# Patient Record
Sex: Male | Born: 1995 | Race: Black or African American | Hispanic: No | Marital: Single | Smoking: Current every day smoker
Health system: Southern US, Community
[De-identification: ages and names within clinical notes are randomized; demographics above are authoritative.]

---

## 2014-07-09 ENCOUNTER — Emergency Department (HOSPITAL_COMMUNITY): Payer: Managed Care, Other (non HMO)

## 2014-07-09 ENCOUNTER — Emergency Department (HOSPITAL_COMMUNITY)
Admission: EM | Admit: 2014-07-09 | Discharge: 2014-07-12 | Disposition: A | Discharge status: Home / Self Care | Payer: Managed Care, Other (non HMO) | Attending: Emergency Medicine | Admitting: Emergency Medicine

## 2014-07-09 DIAGNOSIS — F29 Unspecified psychosis not due to a substance or known physiological condition: Secondary | ICD-10-CM | POA: Diagnosis not present

## 2014-07-09 DIAGNOSIS — F309 Manic episode, unspecified: Secondary | ICD-10-CM | POA: Diagnosis present

## 2014-07-09 DIAGNOSIS — R000 Tachycardia, unspecified: Secondary | ICD-10-CM | POA: Insufficient documentation

## 2014-07-09 LAB — COMPREHENSIVE METABOLIC PANEL
ALT: 30 U/L (ref 0–53)
AST: 37 U/L (ref 0–37)
Albumin: 5.2 g/dL (ref 3.5–5.2)
Alkaline Phosphatase: 92 U/L (ref 39–117)
Anion gap: 6 (ref 5–15)
BUN: 8 mg/dL (ref 6–23)
CHLORIDE: 102 mmol/L (ref 96–112)
CO2: 25 mmol/L (ref 19–32)
Calcium: 9.3 mg/dL (ref 8.4–10.5)
Creatinine, Ser: 1.23 mg/dL (ref 0.50–1.35)
GFR calc Af Amer: >90 mL/min (ref >90)
GFR, EST NON AFRICAN AMERICAN: 83 mL/min — AB (ref >90)
GLUCOSE: 112 mg/dL — AB (ref 70–99)
Potassium: 3.8 mmol/L (ref 3.5–5.1)
SODIUM: 133 mmol/L — AB (ref 135–145)
Total Bilirubin: 1 mg/dL (ref 0.3–1.2)
Total Protein: 9.1 g/dL — ABNORMAL HIGH (ref 6.0–8.3)

## 2014-07-09 LAB — CBC
HEMATOCRIT: 49.5 % (ref 39.0–52.0)
HEMOGLOBIN: 17 g/dL (ref 13.0–17.0)
MCH: 31.6 pg (ref 26.0–34.0)
MCHC: 34.3 g/dL (ref 30.0–36.0)
MCV: 92 fL (ref 78.0–100.0)
Platelets: 290 10*3/uL (ref 150–400)
RBC: 5.38 MIL/uL (ref 4.22–5.81)
RDW: 12.4 % (ref 11.5–15.5)
WBC: 7.4 10*3/uL (ref 4.0–10.5)

## 2014-07-09 LAB — ACETAMINOPHEN LEVEL: Acetaminophen (Tylenol), Serum: <10 ug/mL — ABNORMAL LOW (ref 10–30)

## 2014-07-09 LAB — SALICYLATE LEVEL

## 2014-07-09 LAB — ETHANOL: Alcohol, Ethyl (B): <5 mg/dL (ref 0–9)

## 2014-07-09 MED ORDER — ZIPRASIDONE MESYLATE 20 MG IM SOLR
20.0000 mg | Freq: Once | INTRAMUSCULAR | Status: AC
  Administered 2014-07-09: 20 mg via INTRAMUSCULAR
  Filled 2014-07-09: qty 20

## 2014-07-09 MED ORDER — ACETAMINOPHEN 325 MG PO TABS: 650.0000 mg | ORAL_TABLET | ORAL | Status: DC | PRN

## 2014-07-09 MED ORDER — STERILE WATER FOR INJECTION IJ SOLN
INTRAMUSCULAR | Status: AC
  Administered 2014-07-09: 1.2 mL
  Filled 2014-07-09: qty 10

## 2014-07-09 MED ORDER — ONDANSETRON HCL 4 MG PO TABS: 4.0000 mg | ORAL_TABLET | Freq: Three times a day (TID) | ORAL | Status: DC | PRN

## 2014-07-09 NOTE — ED Notes (Addendum)
Pt brought in by [employer], IVC. Staff unable to get direct answers from pt. Per IVC paperwork pt unable to speak coherently and states his name means Brooke DareKing, [employer] Angel, and the devil. He reports he is able to hypnotize people. Pt is delusional with hallucinations.  Pt has no hx of commitment. Unable to determine if pt is SI/HI. Unable to complete triage pt unable to answer questions. Pt family will be here tomorrow (traveling from [place]).

## 2014-07-09 NOTE — ED Provider Notes (Signed)
CSN: [id number]     Arrival date & time 07/09/14  2030 History  This chart was scribed for Elpidio AnisShari Radie Berges, PA-C, working with Pricilla LovelessScott Goldston, MD by Elon SpannerGarrett Cook, ED Scribe. This patient was seen in room [facility] and the patient's care was started at 9:50 PM.   Chief Complaint  Patient presents with  . Manic Behavior   The history is provided by the patient. The history is limited by the condition of the patient. No language interpreter was used.  Level 5 Caveat (Acute Psychosis) HPI Comments: Art BuffMalik Mcquain is a 19 y.o. male with no psychiatric history [facility] who presents to the Emergency Department complaining of manic behavior onset four days ago.  This evening, [employer] was called to the patient's home by several of the patient's friends who had become concerned about his recent behavioral change. GPD reports they were told by the patient's friends that he has recently experienced some significant stressors including breaking up with his girlfriend, being cut off financially by parents, and being withdrawn from his college classes due to his GPA.  Per nursing notes, the patient is speaking in a fragmented, grandiose, incoherent manner stating he is able to hypnotize people and that he will steal your soul.  Per [employer], the patient has no previous psychiatric diagnosis and takes no medications.    No past medical history on file. No past surgical history on file. No family history on file. History  Substance Use Topics  . Smoking status: Not on file  . Smokeless tobacco: Not on file  . Alcohol Use: Not on file    Review of Systems  Unable to perform ROS: Psychiatric disorder      Allergies  Review of patient's allergies indicates not on file.  Home Medications   Prior to Admission medications   Not on File   BP 153/99 mmHg  Pulse 112  Temp(Src) 100.1 F (37.8 C)  Resp 18  SpO2 93% Physical Exam  Constitutional: He appears well-developed and well-nourished. No  distress.  HENT:  Head: Normocephalic and atraumatic.  Eyes: Conjunctivae and EOM are normal.  Neck: Neck supple. No tracheal deviation present.  Cardiovascular: Tachycardia present.   Pulmonary/Chest: Effort normal. No respiratory distress.  Musculoskeletal: Normal range of motion.  Neurological: He is alert.  Psychiatric: His speech is normal. His affect is angry. He is agitated and actively hallucinating.  Nursing note and vitals reviewed.   ED Course  Procedures (including critical care time) Labs Review Labs Reviewed  ACETAMINOPHEN LEVEL  CBC  COMPREHENSIVE METABOLIC PANEL  ETHANOL  SALICYLATE LEVEL  URINE RAPID DRUG SCREEN (HOSP PERFORMED)    Imaging Review No results found.   EKG Interpretation None      MDM   Final diagnoses:  None    1. Acute psychosis  No reliable history can be obtained from the patient secondary to current mental state. He refuses lab draw, hands on physical exam. He is not cooperative and is borderline aggressive, per [employer]. He is currently in handcuff restraint. Discussed the patient with Dr. Criss AlvineGoldston. Geodon ordered to facilitate full medical evaluation and for patient and staff safety. TTS consult pending for placement.  I personally performed the services described in this documentation, which was scribed in my presence. The recorded information has been reviewed and is accurate.     Elpidio AnisShari Steadman Prosperi, PA-C 07/09/14 2244  Pricilla LovelessScott Goldston, MD 07/10/14 [id number]

## 2014-07-09 NOTE — ED Notes (Signed)
Pt currently resting, sitter at bedside. Restraints intact

## 2014-07-09 NOTE — ED Notes (Signed)
Unable to collect labs at this time because of patient behavior.  I made nurse aware.

## 2014-07-09 NOTE — ED Notes (Signed)
Patient transported to CT w/ sitter

## 2014-07-09 NOTE — ED Notes (Signed)
Pt speaking in racial language, speaking to "society", pt speaking to self, unable to direct or ask questions at this time, [employer] at bedside.

## 2014-07-10 DIAGNOSIS — F29 Unspecified psychosis not due to a substance or known physiological condition: Secondary | ICD-10-CM | POA: Diagnosis not present

## 2014-07-10 LAB — RAPID URINE DRUG SCREEN, HOSP PERFORMED
Amphetamines: NOT DETECTED
Barbiturates: NOT DETECTED
Benzodiazepines: NOT DETECTED
Cocaine: NOT DETECTED
OPIATES: NOT DETECTED
Tetrahydrocannabinol: POSITIVE — AB

## 2014-07-10 MED ORDER — HYDROXYZINE HCL 25 MG PO TABS: 50.0000 mg | ORAL_TABLET | Freq: Three times a day (TID) | ORAL | Status: DC | PRN

## 2014-07-10 NOTE — ED Notes (Signed)
Aware urine sample is needed. 

## 2014-07-10 NOTE — ED Notes (Signed)
[facility] Expected date:  Expected time:  Means of arrival:  Comments: [facility] 26

## 2014-07-10 NOTE — ED Notes (Signed)
Pt has flight of ideas and goes from one extreme to the next. Pt reports that he has ADHD and just wants to talk.

## 2014-07-10 NOTE — ED Notes (Signed)
Continues to have disorganized thoughts and needs consistent redirection.  Acuity moderate to high.

## 2014-07-10 NOTE — ED Notes (Addendum)
Pt's mother contacted TCU (confirmed by birth date and patient)-patient is speaking with her at this time.   Mother called back, informed of plan of care for patient. Family will be here from [place] around 1600.

## 2014-07-10 NOTE — ED Notes (Signed)
Pt reported that he does not eat. Has not eaten breakfast or lunch. Reports that he does not want anything to drink at this time.

## 2014-07-10 NOTE — ED Notes (Signed)
Pt comes to the desk and talks about random things and even gets animated in telling us that we are stupid. Reports that he does not need his family now and that they have not been around for 2 years only his brother. Comes to the desk and says that his father is a donor and donors can check on their sperm. Then proceeds to say that [facility] [employer] and going to write a movie. Reports that we do not see what he can because he can see into the future. Reports that he is going to be a star and already is. Pt reported that he does not have to listen to us because we are asking him to do things that he does not want to and that he will listen for now. Keeps repeating that we think that he is dumb but he is not.

## 2014-07-10 NOTE — ED Notes (Signed)
When pt returned from CT restraints were kept off, pt resting at this time, in no distress.

## 2014-07-10 NOTE — Progress Notes (Signed)
[place] [facility] [place] informed CM [place] therefore [place] Pt informed CM he "did something stupid to get in here but I'm going to change me ways"  CM encouraged pt and allow him time to ventilate.

## 2014-07-10 NOTE — ED Notes (Signed)
Patient grandiose and hyperverbal.  Fluids given along with urine cup.  Acuity moderate.

## 2014-07-10 NOTE — ED Notes (Signed)
Pt requesting to have [employer] telephone number reporting that the guy that owns it is the band director named Ron that would be investing in his dream. Someone that he is calling his dad.

## 2014-07-10 NOTE — BH Assessment (Signed)
Assessment Note  Stephen Raymond is an 19 y.o. male brought in by [employer]. Sts that friends called [employer] reporting patient was behaving in a bizarre manner. Writer met with patient who presented with disorganized thought and speech.  He was hyperverbal, tangential, and grandiose during the assessment.Patient who was accompanied by a school friend. Patient sts that the friend in the room with him is his best friend, they attend [employer] together, and both play drums in the band.The friend concurred with this information. Patient reported that he moved form [place], [place] to attend [employer].Patient however sts that he has completed school but still plays in the band. Patient sts that that he has a twin brother that was living in [place], [place] but moved back to [place]. Patient has not further family in the [place] area. Throughout the assessment patient had flight of ideas. This writer would redirect patient who was fixated on ""God and a Girl". Patient denies previous psychiatric history, hospitalizations, etc. He denies SI and HI. He also denies alcohol and drug use. Patient's ETOH is negative and UDS has not yet resulted during the time of this assessment.   Patient was a poor historian and not able to provide a lot of information during the assessment. Patient's mother is expected to be here in the ED approx. 4pm. This Probation officer or another TTS counselor will need to obtain collateral information from his mother.  Patient evaluated by Reginold Agent, NP and inpatient treatment was recommended.   Axis I:  Psychotic Disorder NOS Axis II: Deferred Axis III: No past medical history on file. Axis IV: other psychosocial or environmental problems, problems related to social environment, problems with access to health care services and problems with primary support group Axis V: 31-40 impairment in reality testing   Past Medical History: No past medical history on file.  No past surgical history on  file.  Family History: No family history on file.  Social History:  has no tobacco, alcohol, and drug history on file.  Additional Social History:  Alcohol / Drug Use Pain Medications: SEE MAR Prescriptions: SEE MAR Over the Counter: SEE MAR History of alcohol / drug use?: No history of alcohol / drug abuse (No UDS resulted as of 07/10/2014 @ 1441; ETOH negative )  CIWA: CIWA-Ar BP: 146/91 mmHg Pulse Rate: 74 COWS:    Allergies: No Known Allergies  Home Medications:  (Not in a hospital admission)  OB/GYN Status:  No LMP for male patient.  General Assessment Data Location of Assessment: [facility] Is this a Tele or Face-to-Face Assessment?: Face-to-Face Is this an Initial Assessment or a Re-assessment for this encounter?: Initial Assessment Can pt return to current living arrangement?: No Admission Status: Voluntary Is patient capable of signing voluntary admission?: Yes Transfer from: [facility] Referral Source: Self/Family/Friend     [place] Name of Psychiatrist:  (No psychiatrist ) Name of Therapist:  (No therapist )  Education Status Is patient currently in school?: No  Risk to self with the past 6 months Suicidal Ideation: No Suicidal Intent: No Is patient at risk for suicide?: No Suicidal Plan?: No Access to Means: No What has been your use of drugs/alcohol within the last 12 months?:  (patient denies ) Previous Attempts/Gestures: No How many times?:  (n/a) Other Self Harm Risks:  (n/a) Triggers for Past Attempts: Other (Comment) (no previous attempts or gestures ) Intentional Self Injurious Behavior: None Recent stressful life event(s): Other (Comment) (patient reports  ) Persecutory voices/beliefs?:  No Depression: Yes Depression Symptoms: Feeling angry/irritable, Feeling worthless/self pity, Guilt, Loss of interest in usual pleasures, Fatigue, Isolating, Tearfulness, Insomnia, Despondent Substance abuse history and/or treatment for  substance abuse?: No Suicide prevention information given to non-admitted patients: Not applicable  Risk to Others within the past 6 months Homicidal Ideation: No Thoughts of Harm to Others: No Current Homicidal Intent: No Current Homicidal Plan: No Access to Homicidal Means: No Identified Victim:  (n/a) History of harm to others?: No Assessment of Violence: None Noted Violent Behavior Description:  (patient is calm and cooperative ) Does patient have access to weapons?: No Criminal Charges Pending?: No Does patient have a court date: No  Psychosis Hallucinations: None noted (bizarre and manic behaviors although pt denies AVH's) Delusions: Unspecified (believes that he is able to hypnotize people)  Mental Status Report Appearance/Hygiene: In scrubs Eye Contact: Good Motor Activity: Freedom of movement Speech: Logical/coherent Level of Consciousness: Alert Mood: Depressed Affect: Appropriate to circumstance Anxiety Level: None Thought Processes: Relevant, Coherent Judgement: Impaired Orientation: Place, Person, Situation, Time Obsessive Compulsive Thoughts/Behaviors: None  Cognitive Functioning Concentration: Decreased Memory: Recent Intact, Remote Intact IQ: Average Insight: Poor Impulse Control: Poor Appetite: Good Weight Loss:  (none reported ) Weight Gain:  (none reported ) Sleep: Unable to Assess Total Hours of Sleep:  (unk) Vegetative Symptoms: Unable to Assess  ADLScreening [facility] Assessment Services) Patient's cognitive ability adequate to safely complete daily activities?: Yes Patient able to express need for assistance with ADLs?: No Independently performs ADLs?: Yes (appropriate for developmental age)  Prior Inpatient Therapy Prior Inpatient Therapy: No Prior Therapy Dates:  (n/a) Prior Therapy Facilty/Provider(s):  (n/a) Reason for Treatment:  (n/a)  Prior Outpatient Therapy Prior Outpatient Therapy: No Prior Therapy Dates:  (n/a) Prior Therapy  Facilty/Provider(s):  (n/a) Reason for Treatment:  (n/a)  ADL Screening (condition at time of admission) Patient's cognitive ability adequate to safely complete daily activities?: Yes Is the patient deaf or have difficulty hearing?: No Does the patient have difficulty seeing, even when wearing glasses/contacts?: No Does the patient have difficulty concentrating, remembering, or making decisions?: Yes (due to patient's psychosis) Patient able to express need for assistance with ADLs?: No Does the patient have difficulty dressing or bathing?: No Independently performs ADLs?: Yes (appropriate for developmental age) Does the patient have difficulty walking or climbing stairs?: No Weakness of Legs: None Weakness of Arms/Hands: None  Home Assistive Devices/Equipment Home Assistive Devices/Equipment: None    Abuse/Neglect Assessment (Assessment to be complete while patient is alone) Physical Abuse: Denies Verbal Abuse: Denies Sexual Abuse: Denies Exploitation of patient/patient's resources: Denies Self-Neglect: Denies Values / Beliefs Cultural Requests During Hospitalization: None Spiritual Requests During Hospitalization: None   Advance Directives (For Healthcare) Does patient have an advance directive?: No    Additional Information 1:1 In Past 12 Months?: No CIRT Risk: No Elopement Risk: No Does patient have medical clearance?: Yes     Disposition:  Disposition Initial Assessment Completed for this Encounter: Yes Disposition of Patient: Inpatient treatment program Type of inpatient treatment program: Adult (Dr. Reginold Agent & Dr. Darleene Cleaver recommend)  On Site Evaluation by:   Reviewed with Physician:    Waldon Merl [facility] 07/10/2014 3:04 PM

## 2014-07-10 NOTE — Consult Note (Signed)
[facility] Consult   Reason for Consult: Psychosis, Unspecified Referring Physician: EDP Patient Identification: Stephen Raymond MRN:  [id number] Principal Diagnosis: Psychosis Diagnosis:   Patient Active Problem List   Diagnosis Date Noted  . Psychosis [F29] 07/10/2014    Priority: High    Total Time spent with patient: 1 hour  Subjective:   Stephen Raymond is a 19 y.o. male patient admitted with Psychosis.  HPI:  AA male 19 years old was brought in by Stephen Raymond for disorganized thought and speech.  This morning he could not participate in this interview.  He was tangential and did not make sense in his his speech.  He reported that he moved form [place] and wanted to go to school.  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  Patient   Reported that he has two dads, one is a step dad and one is a biological dad who he look more like.  He also stated that he has  a mother.  Patient then stated that he does not have anybody in [place].  Patient denied any drug use.  At times he rambles off topic and needed to be redirected back to topic in discussion.  We have accepted patient for admission and will be seeking placement at any facility with available bed.  We will be gathering collateral information from his mother.  HPI Elements:   Location:  Psychosis. Quality:  severe. Severity:  severe. Timing:  Acute. Duration:  Sudden onset. Context:  Brought for  evaluation ofdisorganized thought .  Past Medical History: No past medical history on file. No past surgical history on file. Family History: No family history on file. Social History:  History  Alcohol Use: Not on file     History  Drug Use Not on file    History   Social History  . Marital Status: Single    Spouse Name: N/A  . Number of Children: N/A  . Years of Education: N/A   Social History Main Topics  . Smoking status: Not on file  . Smokeless tobacco: Not on  file  . Alcohol Use: Not on file  . Drug Use: Not on file  . Sexual Activity: Not on file   Other Topics Concern  . Not on file   Social History Narrative  . No narrative on file   Additional Social History:                          Allergies:  No Known Allergies  Labs:  Results for orders placed or performed during the Raymond encounter of 07/09/14 (from the past 48 hour(s))  Acetaminophen level     Status: Abnormal   Collection Time: 07/09/14 10:25 PM  Result Value Ref Range   Acetaminophen (Tylenol), Serum <10.0 (L) 10 - 30 ug/mL    Comment:        THERAPEUTIC CONCENTRATIONS VARY SIGNIFICANTLY. A RANGE OF 10-30 ug/mL MAY BE AN EFFECTIVE CONCENTRATION FOR MANY PATIENTS. HOWEVER, SOME ARE BEST TREATED AT CONCENTRATIONS OUTSIDE THIS RANGE. ACETAMINOPHEN CONCENTRATIONS >150 ug/mL AT 4 HOURS AFTER INGESTION AND >50 ug/mL AT 12 HOURS AFTER INGESTION ARE OFTEN ASSOCIATED WITH TOXIC REACTIONS.   CBC     Status: None   Collection Time: 07/09/14 10:25 PM  Result Value Ref Range   WBC 7.4 4.0 - 10.5 K/uL   RBC 5.38 4.22 - 5.81 MIL/uL  Hemoglobin 17.0 13.0 - 17.0 g/dL   HCT 49.5 39.0 - 52.0 %   MCV 92.0 78.0 - 100.0 fL   MCH 31.6 26.0 - 34.0 pg   MCHC 34.3 30.0 - 36.0 g/dL   RDW 12.4 11.5 - 15.5 %   Platelets 290 150 - 400 K/uL  Comprehensive metabolic panel     Status: Abnormal   Collection Time: 07/09/14 10:25 PM  Result Value Ref Range   Sodium 133 (L) 135 - 145 mmol/L   Potassium 3.8 3.5 - 5.1 mmol/L   Chloride 102 96 - 112 mmol/L   CO2 25 19 - 32 mmol/L   Glucose, Bld 112 (H) 70 - 99 mg/dL   BUN 8 6 - 23 mg/dL   Creatinine, Ser 1.23 0.50 - 1.35 mg/dL   Calcium 9.3 8.4 - 10.5 mg/dL   Total Protein 9.1 (H) 6.0 - 8.3 g/dL   Albumin 5.2 3.5 - 5.2 g/dL   AST 37 0 - 37 U/L   ALT 30 0 - 53 U/L   Alkaline Phosphatase 92 39 - 117 U/L   Total Bilirubin 1.0 0.3 - 1.2 mg/dL   GFR calc non Af Amer 83 (L) >90 mL/min   GFR calc Af Amer >90 >90 mL/min     Comment: (NOTE) The eGFR has been calculated using the CKD EPI equation. This calculation has not been validated in all clinical situations. eGFR's persistently <90 mL/min signify possible Chronic Kidney Disease.    Anion gap 6 5 - 15  Ethanol (ETOH)     Status: None   Collection Time: 07/09/14 10:25 PM  Result Value Ref Range   Alcohol, Ethyl (B) <5 0 - 9 mg/dL    Comment:        LOWEST DETECTABLE LIMIT FOR SERUM ALCOHOL IS 11 mg/dL FOR MEDICAL PURPOSES ONLY   Salicylate level     Status: None   Collection Time: 07/09/14 10:25 PM  Result Value Ref Range   Salicylate Lvl <8.7 2.8 - 20.0 mg/dL    Vitals: Blood pressure 105/47, pulse 103, temperature 97.5 F (36.4 C), temperature source Oral, resp. rate 20, SpO2 95 %.  Risk to Self: Is patient at risk for suicide?: No, but patient needs Medical Clearance Risk to Others:   Prior Inpatient Therapy:   Prior Outpatient Therapy:    Current Facility-Administered Medications  Medication Dose Route Frequency Provider Last Rate Last Dose  . acetaminophen (TYLENOL) tablet 650 mg  650 mg Oral Q4H PRN Charlann Lange, PA-C      . ondansetron (ZOFRAN) tablet 4 mg  4 mg Oral Q8H PRN Charlann Lange, PA-C       No current outpatient prescriptions on file.    Musculoskeletal: Strength & Muscle Tone: within normal limits Gait & Station: normal Patient leans: N/A  Psychiatric Specialty Exam:     Blood pressure 105/47, pulse 103, temperature 97.5 F (36.4 C), temperature source Oral, resp. rate 20, SpO2 95 %.There is no height or weight on file to calculate BMI.  General Appearance: Casual and Fairly Groomed  Engineer, water::  Fair  Speech:  Normal Rate and tangential,  Volume:  Normal  Mood:  Anxious  Affect:  Congruent and Constricted  Thought Process:  Circumstantial, Disorganized and Tangential  Orientation:  Full (Time, Place, and Person)  Thought Content:  Delusions  Suicidal Thoughts:  No  Homicidal Thoughts:  No  Memory:   Immediate;   Poor Recent;   Poor Remote;   Poor  Judgement:  Impaired  Insight:  Lacking  Psychomotor Activity:  Normal  Concentration:  Poor  Recall:  NA  Fund of Knowledge:Poor  Language: Fair  Akathisia:  NA  Handed:  Right  AIMS (if indicated):     Assets:  Desire for Improvement  ADL's:  Intact  Cognition: Impaired,  Severe  Sleep:      Medical Decision Making: Review of Psycho-Social Stressors (1) and Established Problem, Worsening (2)  Treatment Plan Summary: Daily contact with patient to assess and evaluate symptoms and progress in treatment, Medication management and Plan admit and seek placement  Plan:  Recommend psychiatric Inpatient admission when medically cleared. Disposition: see above  Delfin Gant   PMHNP-BC 07/10/2014 11:45 AM Patient seen face-to-face for psychiatric evaluation, chart reviewed and case discussed with the physician extender and developed treatment plan. Reviewed the information documented and agree with the treatment plan. Corena Pilgrim, MD

## 2014-07-10 NOTE — ED Notes (Addendum)
Pt agreed to have a shower.  Has one visitor at this time. Social work in to speak with patient.  Refuses Hydroxyzine at this time.

## 2014-07-10 NOTE — ED Notes (Signed)
Pt informed he only has three allowed phone calls per day. Pt asks, "well is it written down somewhere?" Given [facility] overview rules. 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

## 2014-07-10 NOTE — ED Notes (Signed)
Patient's father updated with basic information-father able to give identifying information regarding patient information and situation. Patient knows to contact patient's mother for updated information once she arrives here.

## 2014-07-10 NOTE — BH Assessment (Signed)
[facility] Assessment Progress Note  The following facilities have been contacted in an effort to place this patient, with results as noted:  Beds available, information faxed, decision pending:  [facility]  No IPRS beds currently available:  [facility]  At capacity:  [facility]   Burle Kwan, MA Triage Specialist [phone]     

## 2014-07-10 NOTE — ED Notes (Signed)
Pt taking shower at present, calm & cooperative, interactive with staff.  Monitoring for safety, Q 15 min checks in effect.  AAO x 3, no distress noted.

## 2014-07-10 NOTE — ED Notes (Signed)
Psych MD and PA at bedside to interview pt.

## 2014-07-10 NOTE — ED Notes (Signed)
Pt has come to the desk and asked to be able to explain to everyone why God is real.  He is calm and cooperative and has been asked to remain in his room.

## 2014-07-11 DIAGNOSIS — F29 Unspecified psychosis not due to a substance or known physiological condition: Secondary | ICD-10-CM | POA: Insufficient documentation

## 2014-07-11 MED ORDER — RISPERIDONE 0.5 MG PO TABS
0.5000 mg | ORAL_TABLET | Freq: Two times a day (BID) | ORAL | Status: DC
  Administered 2014-07-11 – 2014-07-12 (×3): 0.5 mg via ORAL
  Filled 2014-07-11 (×3): qty 1

## 2014-07-11 MED ORDER — ZIPRASIDONE MESYLATE 20 MG IM SOLR
20.0000 mg | Freq: Once | INTRAMUSCULAR | Status: AC
  Administered 2014-07-11: 20 mg via INTRAMUSCULAR
  Filled 2014-07-11: qty 20

## 2014-07-11 MED ORDER — LORAZEPAM 2 MG/ML IJ SOLN
2.0000 mg | Freq: Once | INTRAMUSCULAR | Status: AC
  Administered 2014-07-11: 2 mg via INTRAMUSCULAR
  Filled 2014-07-11: qty 1

## 2014-07-11 MED ORDER — DIPHENHYDRAMINE HCL 50 MG/ML IJ SOLN
50.0000 mg | Freq: Once | INTRAMUSCULAR | Status: AC
  Administered 2014-07-11: 50 mg via INTRAMUSCULAR
  Filled 2014-07-11: qty 1

## 2014-07-11 NOTE — Consult Note (Signed)
[place] Psychiatry Consult   Reason for Consult: Psychosis, Unspecified Referring Physician: EDP Patient Identification: Stephen Raymond MRN:  [id number] Principal Diagnosis: Psychosis Diagnosis:   Patient Active Problem List   Diagnosis Date Noted  . Psychosis [F29] 07/10/2014    Priority: High    Total Time spent with patient: 1 hour  Subjective:   Stephen Raymond is a 19 y.o. male patient admitted with Psychosis.  HPI:  AA male 19 years old was brought in by [facility] for disorganized thought and speech.  This morning he could not participate in this interview.  He was tangential and did not make sense in his his speech.  He reported that he moved form [place] and wanted to go to school.  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  Patient   Reported that he has two dads, one is a step dad and one is a biological dad who he look more like.  He also stated that he has  a mother.  Patient then stated that he does not have anybody in [place].  Patient denied any drug use.  At times he rambles off topic and needed to be redirected back to topic in discussion.  We have accepted patient for admission and will be seeking placement at any facility with available bed.  We will be gathering collateral information from his mother.  07/11/2014:  Patient was seen today with her mother at the bedside.  Mother drove down from [place] to see patient.  Patient is still disorganized and tangential needing redirecting.  Patient admitted to using Marijuana but denied using any other drug.  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  His mother reported that Patient's grand mothers twin sister on his father's side had mental illness before dying.  Patient denied SI/HI/AVH.  He is started on Risperdal and we are seeking placement at any hospital with available bed.  HPI Elements:   Location:  Psychosis. Quality:  severe. Severity:   severe. Timing:  Acute. Duration:  Sudden onset. Context:  Brought for  evaluation ofdisorganized thought .  Past Medical History: No past medical history on file. No past surgical history on file. Family History: No family history on file. Social History:  History  Alcohol Use: Not on file     History  Drug Use Not on file    History   Social History  . Marital Status: Single    Spouse Name: N/A  . Number of Children: N/A  . Years of Education: N/A   Social History Main Topics  . Smoking status: Not on file  . Smokeless tobacco: Not on file  . Alcohol Use: Not on file  . Drug Use: Not on file  . Sexual Activity: Not on file   Other Topics Concern  . Not on file   Social History Narrative  . No narrative on file   Additional Social History:    Pain Medications: SEE MAR Prescriptions: SEE MAR Over the Counter: SEE MAR History of alcohol / drug use?: No history of alcohol / drug abuse (No UDS resulted as of 07/10/2014 @ 1441; ETOH negative )                     Allergies:  No Known Allergies  Labs:  Results for orders placed or performed during the hospital encounter of 07/09/14 (from the past 48 hour(s))  Acetaminophen level     Status: Abnormal   Collection Time: 07/09/14 10:25 PM  Result Value Ref Range   Acetaminophen (Tylenol), Serum <10.0 (L) 10 - 30 ug/mL    Comment:        THERAPEUTIC CONCENTRATIONS VARY SIGNIFICANTLY. A RANGE OF 10-30 ug/mL MAY BE AN EFFECTIVE CONCENTRATION FOR MANY PATIENTS. HOWEVER, SOME ARE BEST TREATED AT CONCENTRATIONS OUTSIDE THIS RANGE. ACETAMINOPHEN CONCENTRATIONS >150 ug/mL AT 4 HOURS AFTER INGESTION AND >50 ug/mL AT 12 HOURS AFTER INGESTION ARE OFTEN ASSOCIATED WITH TOXIC REACTIONS.   CBC     Status: None   Collection Time: 07/09/14 10:25 PM  Result Value Ref Range   WBC 7.4 4.0 - 10.5 K/uL   RBC 5.38 4.22 - 5.81 MIL/uL   Hemoglobin 17.0 13.0 - 17.0 g/dL   HCT 49.5 39.0 - 52.0 %   MCV 92.0 78.0 - 100.0  fL   MCH 31.6 26.0 - 34.0 pg   MCHC 34.3 30.0 - 36.0 g/dL   RDW 12.4 11.5 - 15.5 %   Platelets 290 150 - 400 K/uL  Comprehensive metabolic panel     Status: Abnormal   Collection Time: 07/09/14 10:25 PM  Result Value Ref Range   Sodium 133 (L) 135 - 145 mmol/L   Potassium 3.8 3.5 - 5.1 mmol/L   Chloride 102 96 - 112 mmol/L   CO2 25 19 - 32 mmol/L   Glucose, Bld 112 (H) 70 - 99 mg/dL   BUN 8 6 - 23 mg/dL   Creatinine, Ser 1.23 0.50 - 1.35 mg/dL   Calcium 9.3 8.4 - 10.5 mg/dL   Total Protein 9.1 (H) 6.0 - 8.3 g/dL   Albumin 5.2 3.5 - 5.2 g/dL   AST 37 0 - 37 U/L   ALT 30 0 - 53 U/L   Alkaline Phosphatase 92 39 - 117 U/L   Total Bilirubin 1.0 0.3 - 1.2 mg/dL   GFR calc non Af Amer 83 (L) >90 mL/min   GFR calc Af Amer >90 >90 mL/min    Comment: (NOTE) The eGFR has been calculated using the CKD EPI equation. This calculation has not been validated in all clinical situations. eGFR's persistently <90 mL/min signify possible Chronic Kidney Disease.    Anion gap 6 5 - 15  Ethanol (ETOH)     Status: None   Collection Time: 07/09/14 10:25 PM  Result Value Ref Range   Alcohol, Ethyl (B) <5 0 - 9 mg/dL    Comment:        LOWEST DETECTABLE LIMIT FOR SERUM ALCOHOL IS 11 mg/dL FOR MEDICAL PURPOSES ONLY   Salicylate level     Status: None   Collection Time: 07/09/14 10:25 PM  Result Value Ref Range   Salicylate Lvl <4.7 2.8 - 20.0 mg/dL  Urine Drug Screen     Status: Abnormal   Collection Time: 07/10/14  4:24 PM  Result Value Ref Range   Opiates NONE DETECTED NONE DETECTED   Cocaine NONE DETECTED NONE DETECTED   Benzodiazepines NONE DETECTED NONE DETECTED   Amphetamines NONE DETECTED NONE DETECTED   Tetrahydrocannabinol POSITIVE (A) NONE DETECTED   Barbiturates NONE DETECTED NONE DETECTED    Comment:        DRUG SCREEN FOR MEDICAL PURPOSES ONLY.  IF CONFIRMATION IS NEEDED FOR ANY PURPOSE, NOTIFY LAB WITHIN 5 DAYS.        LOWEST DETECTABLE LIMITS FOR URINE DRUG SCREEN Drug  Class       Cutoff (ng/mL) Amphetamine  1000 Barbiturate      200 Benzodiazepine   097 Tricyclics       353 Opiates          300 Cocaine          300 THC              50     Vitals: Blood pressure 149/78, pulse 85, temperature 98.4 F (36.9 C), temperature source Oral, resp. rate 16, SpO2 100 %.  Risk to Self: Suicidal Ideation: No Suicidal Intent: No Is patient at risk for suicide?: No Suicidal Plan?: No Access to Means: No What has been your use of drugs/alcohol within the last 12 months?:  (patient denies ) How many times?:  (n/a) Other Self Harm Risks:  (n/a) Triggers for Past Attempts: Other (Comment) (no previous attempts or gestures ) Intentional Self Injurious Behavior: None Risk to Others: Homicidal Ideation: No Thoughts of Harm to Others: No Current Homicidal Intent: No Current Homicidal Plan: No Access to Homicidal Means: No Identified Victim:  (n/a) History of harm to others?: No Assessment of Violence: None Noted Violent Behavior Description:  (patient is calm and cooperative ) Does patient have access to weapons?: No Criminal Charges Pending?: No Does patient have a court date: No Prior Inpatient Therapy: Prior Inpatient Therapy: No Prior Therapy Dates:  (n/a) Prior Therapy Facilty/Provider(s):  (n/a) Reason for Treatment:  (n/a) Prior Outpatient Therapy: Prior Outpatient Therapy: No Prior Therapy Dates:  (n/a) Prior Therapy Facilty/Provider(s):  (n/a) Reason for Treatment:  (n/a)  Current Facility-Administered Medications  Medication Dose Route Frequency Provider Last Rate Last Dose  . acetaminophen (TYLENOL) tablet 650 mg  650 mg Oral Q4H PRN Stephen Lange, PA-C      . hydrOXYzine (ATARAX/VISTARIL) tablet 50 mg  50 mg Oral TID PRN Delfin Gant, NP      . ondansetron (ZOFRAN) tablet 4 mg  4 mg Oral Q8H PRN Stephen Lange, PA-C      . risperiDONE (RISPERDAL) tablet 0.5 mg  0.5 mg Oral BID Kamry Faraci   0.5 mg at 07/11/14 1319   No current  outpatient prescriptions on file.    Musculoskeletal: Strength & Muscle Tone: within normal limits Gait & Station: normal Patient leans: N/A  Psychiatric Specialty Exam:     Blood pressure 149/78, pulse 85, temperature 98.4 F (36.9 C), temperature source Oral, resp. rate 16, SpO2 100 %.There is no height or weight on file to calculate BMI.  General Appearance: Casual and Fairly Groomed  Engineer, water::  Fair  Speech:  Normal Rate and tangential,  Volume:  Normal  Mood:  Anxious  Affect:  Congruent and Constricted  Thought Process:  Circumstantial, Disorganized and Tangential  Orientation:  Full (Time, Place, and Person)  Thought Content:  Delusions  Suicidal Thoughts:  No  Homicidal Thoughts:  No  Memory:  Immediate;   Poor Recent;   Poor Remote;   Poor  Judgement:  Impaired  Insight:  Lacking  Psychomotor Activity:  Normal  Concentration:  Poor  Recall:  NA  Fund of Knowledge:Poor  Language: Fair  Akathisia:  NA  Handed:  Right  AIMS (if indicated):     Assets:  Desire for Improvement  ADL's:  Intact  Cognition: Impaired,  Severe  Sleep:      Medical Decision Making: Review of Psycho-Social Stressors (1) and Established Problem, Worsening (2)  Treatment Plan Summary: Daily contact with patient to assess and evaluate symptoms and progress in treatment, Medication management and Plan admit  and seek placement  Plan:  Recommend psychiatric Inpatient admission when medically cleared. Disposition: see above  Delfin Gant   PMHNP-BC 07/11/2014 3:55 PM Patient seen face-to-face for psychiatric evaluation, chart reviewed and case discussed with the physician extender and developed treatment plan. Reviewed the information documented and agree with the treatment plan. Corena Pilgrim, MD

## 2014-07-11 NOTE — ED Notes (Signed)
Acuity level moderate.  Patient received injections earlier this morning, but is now sleeping.

## 2014-07-11 NOTE — ED Notes (Signed)
Pt sleeping at present, no distress noted, monitoring for safety, Q15 min checks in effect. 

## 2014-07-11 NOTE — ED Notes (Signed)
Writer was trying to obtain patient vitals patient would not let me. He kept stiffing his arm and moving around. Will let day shift was unable to obtain vitals.

## 2014-07-11 NOTE — ED Notes (Signed)
Pt skipping down hallway, attempting to walk into other patients rooms.  Pt stating, I just told a lie, I am gay.

## 2014-07-11 NOTE — ED Notes (Signed)
Acuity level remains moderate.  Awaiting placement.  Patient is mildly confused.

## 2014-07-11 NOTE — ED Notes (Signed)
Marijo FileAngela Baker-- mother of patient--[phone].

## 2014-07-11 NOTE — ED Notes (Signed)
Acuity level remains moderate.  No change at this time.

## 2014-07-11 NOTE — Progress Notes (Addendum)
Writer received call from TTS [facility] asking Clinical research associatewriter to seek placement for patient. Writer informed CSW at [employer] Brittney Whitaker that Clinical research associatewriter is working on pt.'s placement.  Writer faxed patient to the following hospitals: [facility] - Per Time WarnerSheron, fax referral. Referral faxed. Per Creg, fax referral in am again as no beds available right now. [facility] fax referral for admission tomorrow. Referral faxed. Per Leeroy Bockhelsea[facility]. [facility] - per Runell Gesseressa, fax referral. Referral faxed. When tried to follow-up - went to voicemail. [facility] - no answer. Per Marcelino DusterMichelle, may have beds, referral under review. [facility] - per Nicholos JohnsKathleen, fax referral for tomorrow d/c.  [facility] - per Morrie SheldonAshley, fax referral. Referral faxed. Per Camelia Engerri, referral under review. [facility] - per Hurshel PartyShaquanna, fax referral. Referral faxed. Per Earlene Plateravis, not taking referrals now.  At capacity: [facility] - per Joni, call back tomorrow. [facility] - per Clayburn PertElizabeth [facility] - per Vincenza HewsShane, at capacity, will have d/c tomorrow, call back then. [facility]  Writer to continue to seek placement/follow up.  [place] Abtsatia Carolee Channell, LCSWA Disposition staff 07/11/2014 6:15 PM

## 2014-07-12 MED ORDER — RISPERIDONE 0.5 MG PO TBDP
0.5000 mg | ORAL_TABLET | Freq: Two times a day (BID) | ORAL | Status: DC
  Filled 2014-07-12 (×2): qty 1

## 2014-07-12 NOTE — ED Notes (Signed)
Pt's mother waiting to talk to the dr.  Laverle PatterMom and patient are aware of transport time

## 2014-07-12 NOTE — ED Notes (Signed)
Pt's mother into see, she is aware that [facility], contact information for hospital given

## 2014-07-12 NOTE — ED Notes (Addendum)
[facility]  IVC papers, EMTELA, transfer report, assessment note, face sheet, MAR report, belongings given to sheriff.  [facility]

## 2014-07-12 NOTE — [facility] Counselor (Addendum)
Per TTS shift report, [facility] Dr Merideth AbbeyEnrique Lopez, according to [facility]. Writer updated pt's RN Janie re: disposition. Writer called [phone] (# for report) to verify pt accepted but no answer. Writer called pt's mom Marijo Filengela Baker [phone] and updated her on disposition including [facility]Excell Seltzer. Baker indicates she will be coming to ED this am and thanked Clinical research associatewriter for info. [facility] [phone] and [facility] Misty StanleyLisa reports pt has been accepted.   Evette Cristalaroline Paige Mace Weinberg, [place] Therapeutic Triage Specialist

## 2014-07-12 NOTE — Consult Note (Signed)
[facility] Face-to-Face Psychiatry Consult   Reason for Consult: Psychosis, Unspecified Referring Physician: EDP Patient Identification: Stephen Raymond MRN:  [id number] Principal Diagnosis: Psychosis Diagnosis:   Patient Active Problem List   Diagnosis Date Noted  . Psychosis [F29] 07/10/2014    Priority: High  . Psychoses [F29]     Total Time spent with patient: 1 hour  Subjective:   Stephen Raymond is a 19 y.o. male patient admitted with Psychosis.  HPI:  AA male 19 years old was brought in by [facility] for disorganized thought and speech.  This morning he could not participate in this interview.  He was tangential and did not make sense in his his speech.  He reported that he moved form [place] and wanted to go to school.  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  Patient   Reported that he has two dads, one is a step dad and one is a biological dad who he look more like.  He also stated that he has  a mother.  Patient then stated that he does not have anybody in [place].  Patient denied any drug use.  At times he rambles off topic and needed to be redirected back to topic in discussion.  We have accepted patient for admission and will be seeking placement at any facility with available bed.  We will be gathering collateral information from his mother.  07/11/2014:  Patient was seen today with her mother at the bedside.  Mother drove down from [place] to see patient.  Patient is still disorganized and tangential needing redirecting.  Patient admitted to using Marijuana but denied using any other drug.  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  His mother reported that Patient's grand mothers twin sister on his father's side had mental illness before dying.  Patient denied SI/HI/AVH.  He is started on Risperdal and we are seeking placement at any hospital with available bed.  Patient [facility] been [facility].  Patient  remains confused and disorganized.  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  Patient stated that he is studying "Psych 202"   Patient will be transferred to [facility] as soon as transportation is here.  HPI Elements:   Location:  Psychosis. Quality:  severe. Severity:  severe. Timing:  Acute. Duration:  Sudden onset. Context:  Brought for  evaluation ofdisorganized thought .  Past Medical History: No past medical history on file. No past surgical history on file. Family History: No family history on file. Social History:  History  Alcohol Use: Not on file     History  Drug Use Not on file    History   Social History  . Marital Status: Single    Spouse Name: N/A  . Number of Children: N/A  . Years of Education: N/A   Social History Main Topics  . Smoking status: Not on file  . Smokeless tobacco: Not on file  . Alcohol Use: Not on file  . Drug Use: Not on file  . Sexual Activity: Not on file   Other Topics Concern  . Not on file   Social History Narrative  . No narrative on file   Additional Social History:    Pain Medications: SEE MAR Prescriptions: SEE MAR Over the Counter: SEE MAR History of alcohol / drug use?: No history of alcohol /  drug abuse (No UDS resulted as of 07/10/2014 @ 1441; ETOH negative )   Allergies:  No Known Allergies  Labs:  Results for orders placed or performed during the hospital encounter of 07/09/14 (from the past 48 hour(s))  Urine Drug Screen     Status: Abnormal   Collection Time: 07/10/14  4:24 PM  Result Value Ref Range   Opiates NONE DETECTED NONE DETECTED   Cocaine NONE DETECTED NONE DETECTED   Benzodiazepines NONE DETECTED NONE DETECTED   Amphetamines NONE DETECTED NONE DETECTED   Tetrahydrocannabinol POSITIVE (A) NONE DETECTED   Barbiturates NONE DETECTED NONE DETECTED    Comment:        DRUG SCREEN FOR MEDICAL PURPOSES ONLY.  IF CONFIRMATION IS NEEDED FOR ANY PURPOSE, NOTIFY LAB WITHIN 5 DAYS.         LOWEST DETECTABLE LIMITS FOR URINE DRUG SCREEN Drug Class       Cutoff (ng/mL) Amphetamine      1000 Barbiturate      200 Benzodiazepine   200 Tricyclics       300 Opiates          300 Cocaine          300 THC              50     Vitals: Blood pressure 155/77, pulse 113, temperature 98.2 F (36.8 C), temperature source Oral, resp. rate 18, SpO2 100 %.  Risk to Self: Suicidal Ideation: No Suicidal Intent: No Is patient at risk for suicide?: No Suicidal Plan?: No Access to Means: No What has been your use of drugs/alcohol within the last 12 months?:  (patient denies ) How many times?:  (n/a) Other Self Harm Risks:  (n/a) Triggers for Past Attempts: Other (Comment) (no previous attempts or gestures ) Intentional Self Injurious Behavior: None Risk to Others: Homicidal Ideation: No Thoughts of Harm to Others: No Current Homicidal Intent: No Current Homicidal Plan: No Access to Homicidal Means: No Identified Victim:  (n/a) History of harm to others?: No Assessment of Violence: None Noted Violent Behavior Description:  (patient is calm and cooperative ) Does patient have access to weapons?: No Criminal Charges Pending?: No Does patient have a court date: No Prior Inpatient Therapy: Prior Inpatient Therapy: No Prior Therapy Dates:  (n/a) Prior Therapy Facilty/Provider(s):  (n/a) Reason for Treatment:  (n/a) Prior Outpatient Therapy: Prior Outpatient Therapy: No Prior Therapy Dates:  (n/a) Prior Therapy Facilty/Provider(s):  (n/a) Reason for Treatment:  (n/a)  Current Facility-Administered Medications  Medication Dose Route Frequency Provider Last Rate Last Dose  . acetaminophen (TYLENOL) tablet 650 mg  650 mg Oral Q4H PRN Elpidio Anis, PA-C      . hydrOXYzine (ATARAX/VISTARIL) tablet 50 mg  50 mg Oral TID PRN Earney Navy, NP      . ondansetron (ZOFRAN) tablet 4 mg  4 mg Oral Q8H PRN Elpidio Anis, PA-C      . risperiDONE (RISPERDAL) tablet 0.5 mg  0.5 mg Oral  BID Joannie Medine   0.5 mg at 07/12/14 1017   No current outpatient prescriptions on file.    Musculoskeletal: Strength & Muscle Tone: within normal limits Gait & Station: normal Patient leans: N/A  Psychiatric Specialty Exam:     Blood pressure 155/77, pulse 113, temperature 98.2 F (36.8 C), temperature source Oral, resp. rate 18, SpO2 100 %.There is no height or weight on file to calculate BMI.  General Appearance: Casual and Fairly Groomed  Patent attorney::  Fair  Speech:  Normal Rate and tangential,  Volume:  Normal  Mood:  Anxious  Affect:  Congruent and Constricted  Thought Process:  Circumstantial, Disorganized and Tangential  Orientation:  Full (Time, Place, and Person)  Thought Content:  Delusions  Suicidal Thoughts:  No  Homicidal Thoughts:  No  Memory:  Immediate;   Poor Recent;   Poor Remote;   Poor  Judgement:  Impaired  Insight:  Lacking  Psychomotor Activity:  Normal  Concentration:  Poor  Recall:  NA  Fund of Knowledge:Poor  Language: Fair  Akathisia:  NA  Handed:  Right  AIMS (if indicated):     Assets:  Desire for Improvement  ADL's:  Intact  Cognition: Impaired,  Severe  Sleep:      Medical Decision Making: Review of Psycho-Social Stressors (1) and Established Problem, Worsening (2)  Treatment Plan Summary: Daily contact with patient to assess and evaluate symptoms and progress in treatment, Medication management and Plan admit and seek placement  Plan:  Recommend psychiatric Inpatient admission when medically cleared. [facility]  Earney NavyONUOHA, JOSEPHINE C   PMHNP-BC 07/12/2014 10:34 AM Patient seen face-to-face for psychiatric evaluation, chart reviewed and case discussed with the physician extender and developed treatment plan. Reviewed the information documented and agree with the treatment plan. Thedore MinsMojeed Trusten Hume, MD

## 2014-07-12 NOTE — ED Notes (Signed)
Sheriff contacted for transport-pt will not be transported until after 3:00Pm

## 2014-07-12 NOTE — ED Notes (Signed)
shweriff called and will be here in 10 mins to transport.  Pt's mother contacted and is aware

## 2014-07-12 NOTE — ED Notes (Addendum)
When she was leaving the patients mother reported that he did not take the medication and "showed" it to her after I left the room.  Patient denies and reports that he did take it.  Unable to locate tablet in room. Will inform MD.

## 2014-07-12 NOTE — ED Notes (Signed)
Change resperdal to  w/ respidal m-tab 0.5mg   Bid and re-medicate  VORB Terressa KoyanagiJosephine O. NP

## 2017-03-15 IMAGING — CT CT HEAD W/O CM
2 series · 16 of 30 positions shown, 20 images · non-contrast
Comparison: None.

CLINICAL DATA: Manic behavior.

EXAM:
CT HEAD WITHOUT CONTRAST
TECHNIQUE: Contiguous axial images were obtained from the base of the skull
through the vertex without intravenous contrast.

[Series 2: head w/o · axial · non-contrast · 0.48mm/px · z∈[-119,+6]mm · 13 of 31 slices shown, 17 images]
[im 3/31  brain]
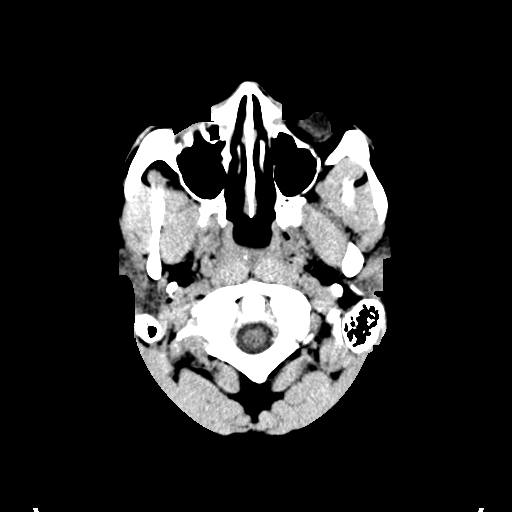
[im 3/31  bone]
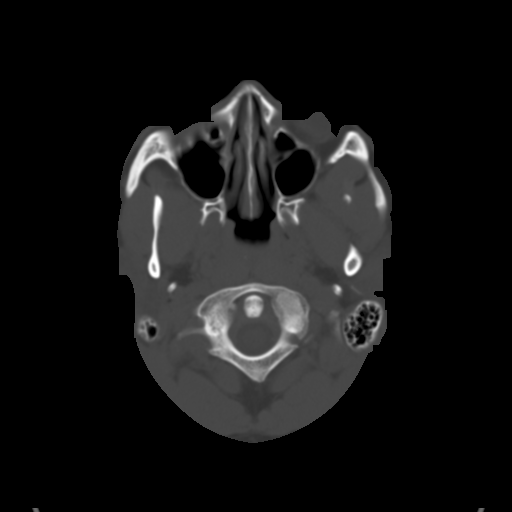
[im 5/31  brain]
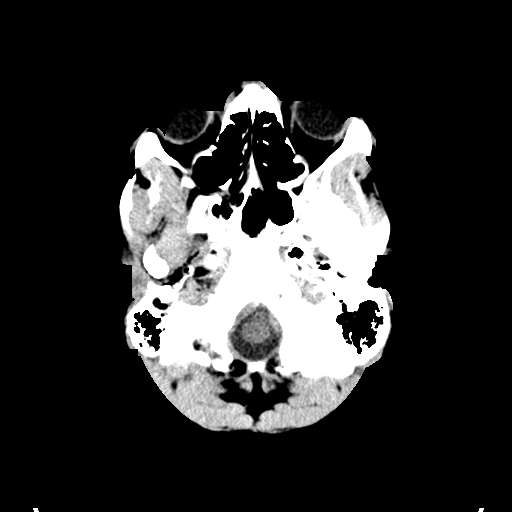
[im 7/31  brain]
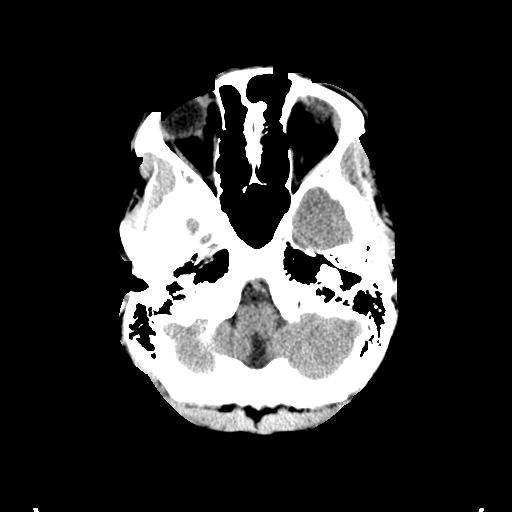
[im 9/31  brain]
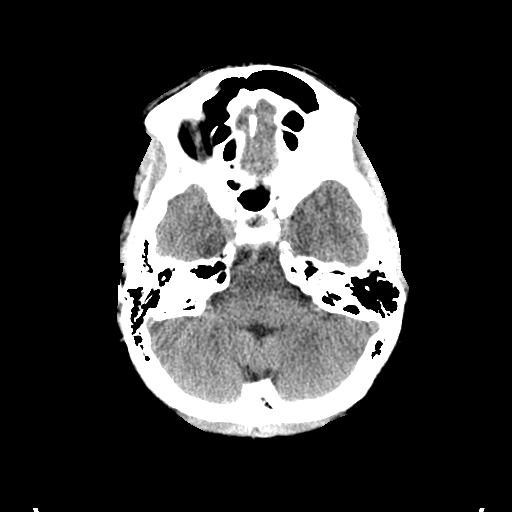
[im 11/31  brain]
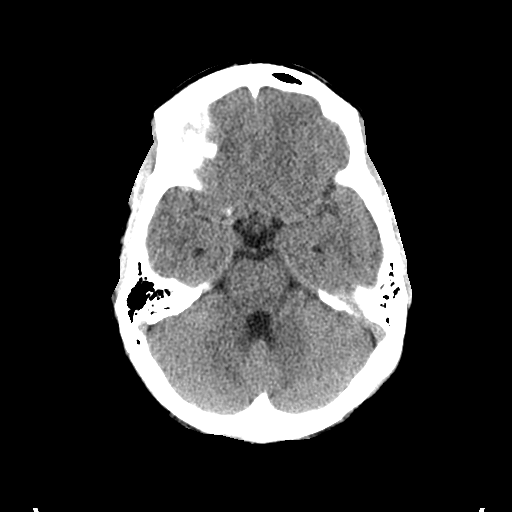
[im 11/31  bone]
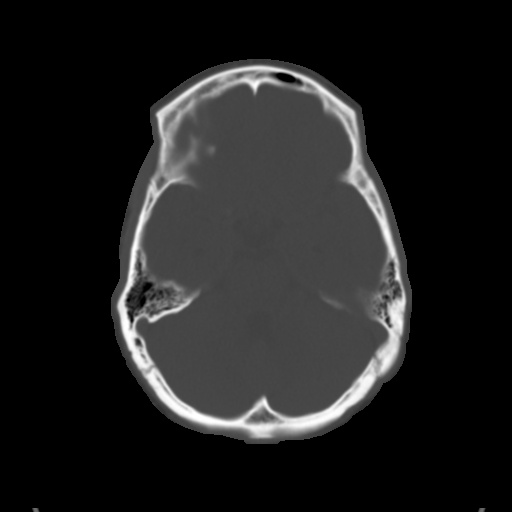
[im 13/31  brain]
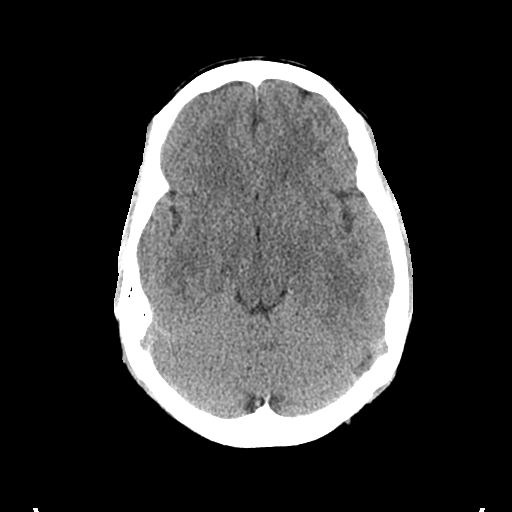
[im 16/31  brain]
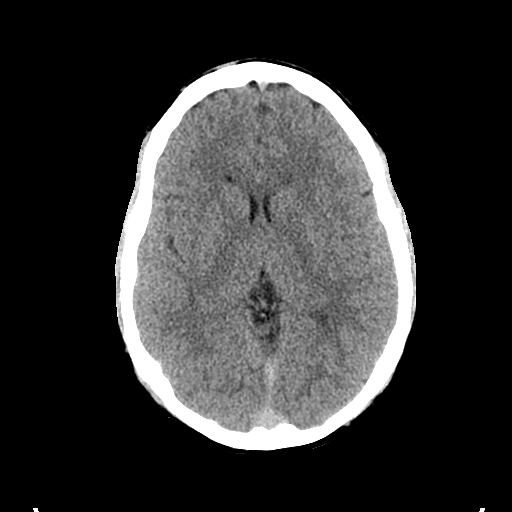
[im 18/31  brain]
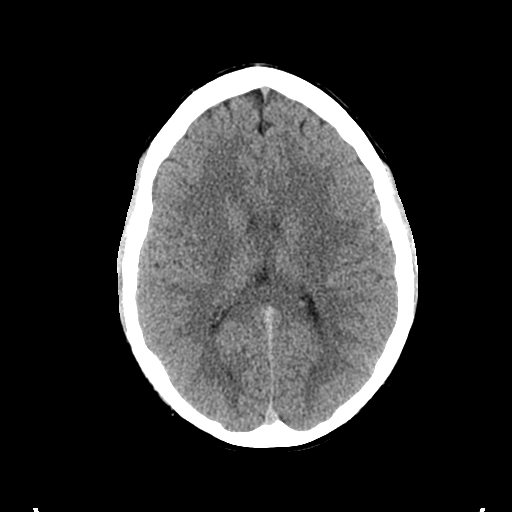
[im 20/31  brain]
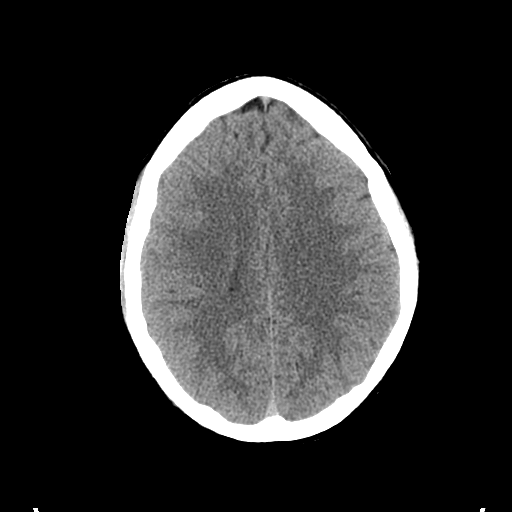
[im 20/31  bone]
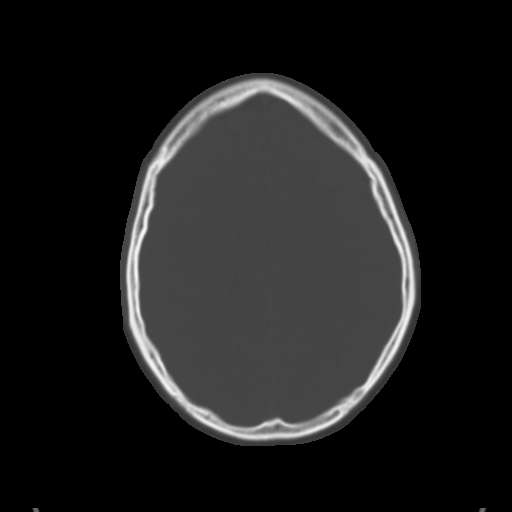
[im 22/31  brain]
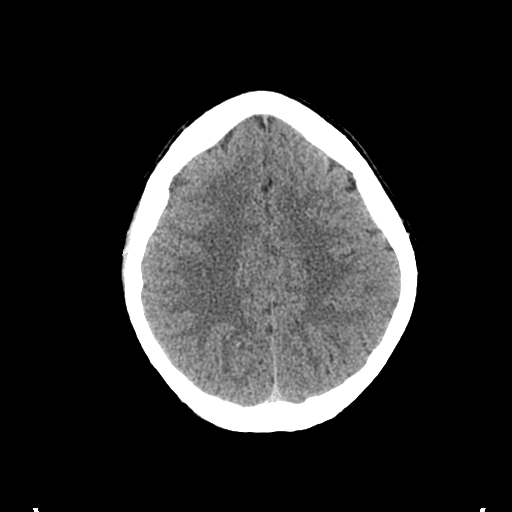
[im 24/31  brain]
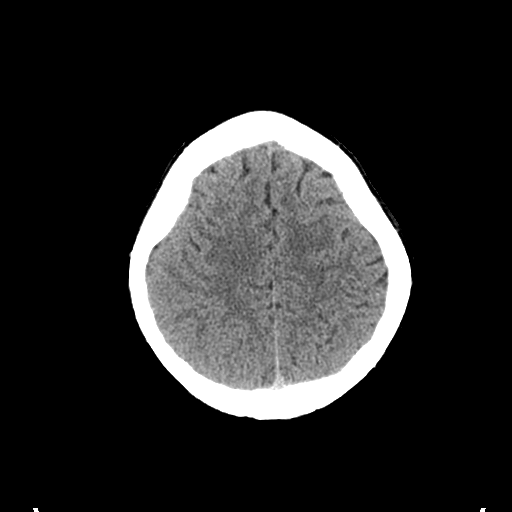
[im 26/31  brain]
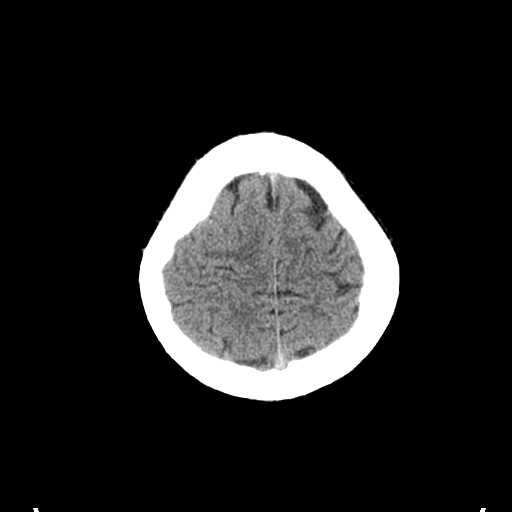
[im 28/31  brain]
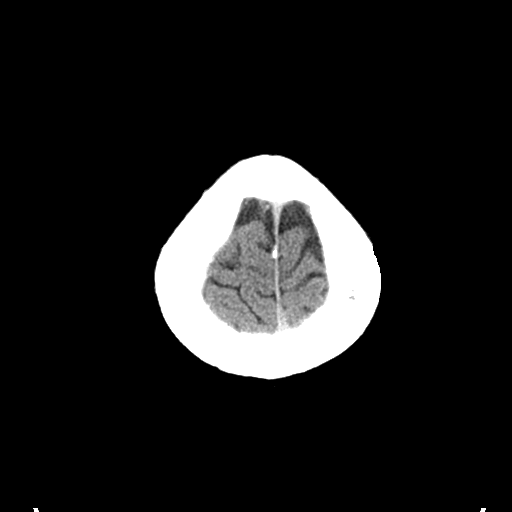
[im 28/31  bone]
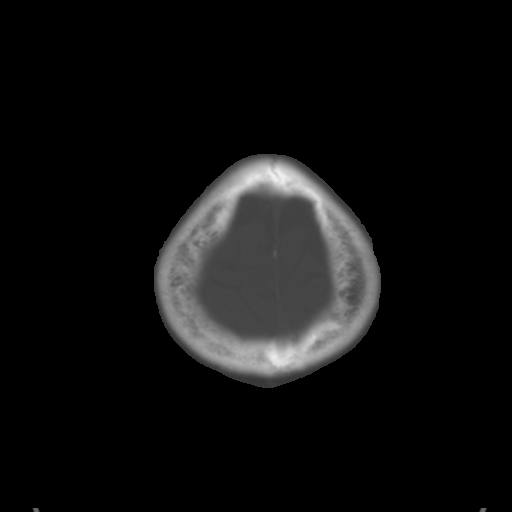

[Series 3: bone windows · axial · 0.48mm/px · z∈[-119,-79]mm · 3 of 31 slices shown]
[im 3/31  bone]
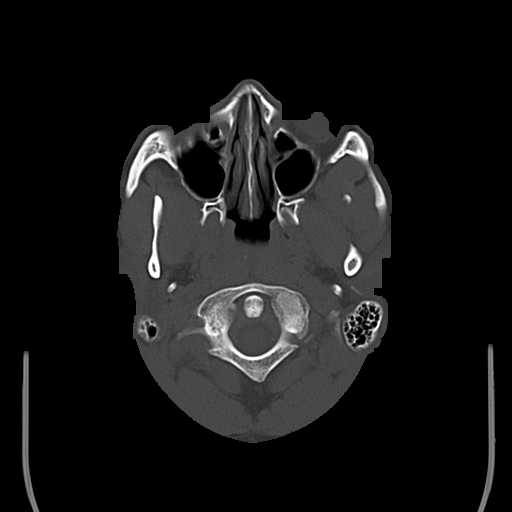
[im 7/31  bone]
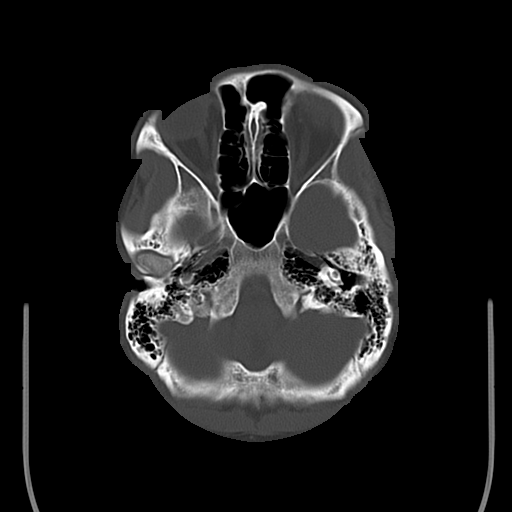
[im 11/31  bone]
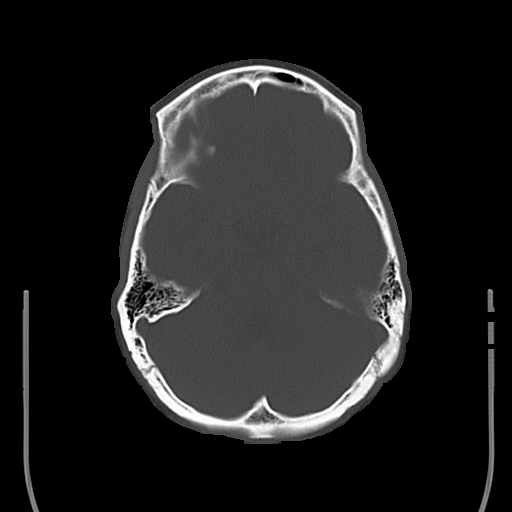

[16 of 30 positions shown; findings below may reference images not displayed]

FINDINGS: Maintained gray-white differentiation. No CT evidence of an acute
infarction. No intraparenchymal hemorrhage, mass, mass effect, or
abnormal extra-axial fluid collection. The ventricles, cisterns,
sulci are normal in size, shape, and position. The visualized
paranasal sinuses and mastoid air cells are predominantly clear.
IMPRESSION: Normal noncontrast head CT.

## 2017-03-20 ENCOUNTER — Emergency Department (HOSPITAL_COMMUNITY)
Admission: EM | Admit: 2017-03-20 | Discharge: 2017-03-20 | Disposition: A | Discharge status: Home / Self Care | Payer: Managed Care, Other (non HMO) | Attending: Emergency Medicine | Admitting: Emergency Medicine

## 2017-03-20 ENCOUNTER — Encounter (HOSPITAL_COMMUNITY): Payer: Self-pay | Admitting: *Deleted

## 2017-03-20 DIAGNOSIS — M436 Torticollis: Secondary | ICD-10-CM | POA: Insufficient documentation

## 2017-03-20 DIAGNOSIS — M542 Cervicalgia: Secondary | ICD-10-CM | POA: Diagnosis present

## 2017-03-20 MED ORDER — IBUPROFEN 600 MG PO TABS: 600.0000 mg | ORAL_TABLET | Freq: Four times a day (QID) | ORAL | 0 refills | Status: AC | PRN

## 2017-03-20 MED ORDER — CYCLOBENZAPRINE HCL 10 MG PO TABS: 10.0000 mg | ORAL_TABLET | Freq: Two times a day (BID) | ORAL | 0 refills | Status: AC | PRN
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
## 2017-03-20 NOTE — ED Notes (Signed)
Declined W/C at D/C and was escorted to lobby by RN. 

## 2018-01-01 ENCOUNTER — Emergency Department (HOSPITAL_COMMUNITY): Payer: Medicaid Other

## 2018-01-01 ENCOUNTER — Emergency Department (HOSPITAL_COMMUNITY)
Admission: EM | Admit: 2018-01-01 | Discharge: 2018-01-01 | Disposition: A | Discharge status: Home / Self Care | Payer: Medicaid Other | Attending: Emergency Medicine | Admitting: Emergency Medicine

## 2018-01-01 ENCOUNTER — Encounter (HOSPITAL_COMMUNITY): Payer: Self-pay | Admitting: Emergency Medicine

## 2018-01-01 DIAGNOSIS — J4521 Mild intermittent asthma with (acute) exacerbation: Secondary | ICD-10-CM | POA: Diagnosis not present

## 2018-01-01 DIAGNOSIS — R062 Wheezing: Secondary | ICD-10-CM | POA: Diagnosis present

## 2018-01-01 MED ORDER — ALBUTEROL SULFATE (2.5 MG/3ML) 0.083% IN NEBU
5.0000 mg | INHALATION_SOLUTION | Freq: Once | RESPIRATORY_TRACT | Status: AC
  Administered 2018-01-01: 5 mg via RESPIRATORY_TRACT
  Filled 2018-01-01: qty 6

## 2018-01-01 MED ORDER — AEROCHAMBER PLUS FLO-VU LARGE MISC
1.0000 | Freq: Once | Status: AC
  Administered 2018-01-01: 1

## 2018-01-01 MED ORDER — PREDNISONE 10 MG PO TABS: 40.0000 mg | ORAL_TABLET | Freq: Every day | ORAL | 0 refills | Status: AC

## 2018-01-01 MED ORDER — ALBUTEROL SULFATE HFA 108 (90 BASE) MCG/ACT IN AERS
1.0000 | INHALATION_SPRAY | RESPIRATORY_TRACT | Status: DC | PRN
  Administered 2018-01-01: 1 via RESPIRATORY_TRACT
  Filled 2018-01-01: qty 6.7

## 2018-01-01 NOTE — ED Notes (Signed)
Patient able to ambulate independently  

## 2018-01-01 NOTE — Discharge Instructions (Addendum)
Please return to the Emergency Department for any new or worsening symptoms or if your symptoms do not improve. Please be sure to follow up with your Primary Care Physician as soon as possible regarding your visit today. If you do not have a Primary Doctor please use the resources below to establish one. You may use the albuterol inhaler given to you today as needed for asthma symptoms.  Please use the prednisone as prescribed to help with your symptoms.  Contact a doctor if: You have make a whistling sound when breaking (wheeze), have shortness of breath, or have a cough even if taking medicine to prevent attacks. The colored mucus you cough up (sputum) is thicker than usual. The colored mucus you cough up changes from clear or white to yellow, green, gray, or bloody. You have problems from the medicine you are taking such as: A rash. Itching. Swelling. Trouble breathing. You need reliever medicines more than 2-3 times a week. Your peak flow measurement is still at 50-79% of your personal best after following the action plan for 1 hour. You have a fever. Get help right away if: You seem to be worse and are not responding to medicine during an asthma attack. You are short of breath even at rest. You get short of breath when doing very little activity. You have trouble eating, drinking, or talking. You have chest pain. You have a fast heartbeat. Your lips or fingernails start to turn blue. You are light-headed, dizzy, or faint. Your peak flow is less than 50% of your personal best.  Do not take your medicine if  develop an itchy rash, swelling in your mouth or lips, or difficulty breathing.   RESOURCE GUIDE  Chronic Pain Problems: Contact [facility]  [phone] Patients need to be referred by their primary care doctor.  Insufficient Money for Medicine: Contact United Way:  call "211" or [facility] [phone] 065-4182.  No Primary Care Doctor: Call Health  Connect  [phone] - can help you locate a primary care doctor that  accepts your insurance, provides certain services, etc. Physician Referral Service- [phone]  Agencies that provide inexpensive medical care: [facility]  [phone] [facility] Internal Medicine  [phone] [facility]  [phone] [facility]  [phone] Planned Parenthood  [phone] [facility]  [phone]  Medicaid-accepting [facility] Providers: [facility]- [address]  [phone], Mon-Fri 9am-7pm, Sat 9am-1pm [facility]- [address] [place], [address] [place]  [phone] [facility]- [address] [place] [address] [place]  [phone] [facility]- [address]  [phone] Renaye Rakers- [address] [place], [address], [phone]  Only accepts [place] patients after they have their name  applied to their card  Self Pay (no insurance) in [facility]: Sickle Cell Patients: Dr Willey Blade, [facility] Internal Medicine  [address] [place], [phone] [facility] Urgent Care- [address] [place]  [phone]       [facility] [place]- [address]       -     [facility]- see information above (Speak to [employer] if you do not have insurance)       -  [facility]- [address] [place], [phone]       -  [facility] [place]- [address],  [phone]       -  [facility]- [address], [phone]       -  Dr Julio Sicks-  [address], [place], [phone]       -  [facility]- [address], [phone]       -  [facility]- [address] [phone], also [address], [phone]       -    [facility]- [address] [employer], [phone], 1st & 3rd Saturday   every month, 10am-1pm  1) Find a Doctor and Pay Out of Pocket Although you won't have to find out who is  covered by your insurance plan, it is a good idea to ask around and get recommendations. You will then need to call the office and see if the doctor you have chosen will accept you as a new patient and what types of options they offer for patients who are self-pay. Some doctors offer discounts or will set up payment plans for their patients who do not have insurance, but you will need to ask so you aren't surprised when you get to your appointment.  2) Contact Your Local Health Department Not all health departments have doctors that can see patients for sick visits, but many do, so it is worth a call to see if yours does. If you don't know where your local health department is, you can check in your phone book. The CDC also has a tool to help you locate your state's health department, and many state websites also have listings of all of their local health departments.  3) Find a Walk-in Clinic If your illness is not likely to be very severe or complicated, you may want to try a walk in clinic. These are popping up all over the country in pharmacies, drugstores, and shopping centers. They're usually staffed by nurse practitioners or physician assistants that have been trained to treat common illnesses and complaints. They're usually fairly quick and inexpensive. However, if you have serious medical issues or chronic medical problems, these are probably not your best option  STD Testing [facility] Department of [facility] [place], STD Clinic, [address], [place], phone [phone] or [phone].  Monday - Friday, call for an appointment. [facility] Department of [employer], STD Clinic, [place] [address], [place], phone [phone] or [phone].  Monday - Friday, call for an appointment.  Abuse/Neglect: [facility] Child Abuse Hotline (361) 244-0803 [facility] Child Abuse Hotline (315)298-3324 (After Hours)  Emergency Shelter:  Venida Jarvis  Ministries 623-071-7652  Maternity Homes: Room at the [place] [facility] (320) 407-3582 [facility] [phone]  MRSA Hotline #:   864-485-7277  [facility] Resources  [facility]  [employer] [facility] Dept. [address]                 [address]         [address] [place] [address]  [address]  [facility] Phone:  [phone]                                  Phone:  [phone]                   Phone:  [phone]  [facility], [phone] [facility]- [phone]       -     [facility] in [place], [address]                                  [phone], [facility] Child Abuse Hotline 484-069-4996 or 7433304154 (After Hours)   Behavioral Health Services  Substance Abuse Resources: Alcohol and Drug Services  (908) 804-8413 [facility] 740-361-3202 [facility] [place] ([phone] Floydene Flock 343-494-1780 Residential & Outpatient Substance Abuse Program  [phone]  Psychological Services: [facility]  [phone] [facility] Services  [phone] [facility], [phone] [place]. [address] [place], ACCESS LINE: [phone] or [phone], [employer]za  Dental Assistance  If unable to pay or uninsured, contact:  [facility] or [facility]. to become qualified for the adult dental clinic.  Patients with Medicaid: [facility] [phone] [address], [phone] [address] [phone]  If unable to pay, or uninsured, contact [facility] (213)268-5429) or [facility] Department [phone] in [place], [phone] in [facility]) to become qualified for the adult dental clinic   Other Low-Cost Community  Dental Services: [facility]- [address] [place], [place], [place], [address], [phone], [address]. [address], 2nd and 4th Thursday of the month at 6:30am.  10 clients each day by appointment, can sometimes see walk-in patients if someone does not show for an appointment. [facility]- [address] [place], [place], [address], [phone] [facility] [address] [place], [place], [address], [phone] [facility] Health Department- [phone] [facility] Health Department- [phone] Outpatient Services East [phone]

## 2018-01-01 NOTE — ED Notes (Signed)
Pt. Was ambulated by tech. Pt. SpO2 remained at 98-100% RA during ambulation.

## 2018-01-01 NOTE — ED Provider Notes (Signed)
[facility] EMERGENCY DEPARTMENT Provider Note   CSN: [id number] Arrival date & time: 01/01/18  4098     History   Chief Complaint Chief Complaint  Patient presents with  . Asthma    HPI Stephen Raymond is a 22 y.o. male presenting for 2 days of increasing wheezing.  Patient states that he has a history of asthma and has been out of medication for approximately 2 years.  Patient states that he feels that the recent weather change has "flared up "his asthma.  Patient describes his symptoms has wheezing that gradually increased 2 days ago and has been constant since yesterday.  Patient has not taken any medications for his symptoms.  Patient states that his symptoms are not worsened by any identifiable factors.  Patient denies fever, chest pain, abdominal pain, nausea/vomiting, rhinorrhea or congestion.  Patient is requesting albuterol inhaler refill today.  Prior to my evaluation patient received single albuterol nebulizer from nursing staff.  Patient states that single nebulizer has completely resolved his symptoms.  States that he is feeling well, denies shortness of breath or wheezing.  Patient is requesting discharge.  Patient states that he does not wish to have additional treatment here in the emergency department.  HPI  History reviewed. No pertinent past medical history.  Patient Active Problem List   Diagnosis Date Noted  . Psychoses (HCC)   . Psychosis (HCC) 07/10/2014    History reviewed. No pertinent surgical history.      Home Medications    Prior to Admission medications   Medication Sig Start Date End Date Taking? Authorizing Provider  cyclobenzaprine (FLEXERIL) 10 MG tablet Take 1 tablet (10 mg total) by mouth 2 (two) times daily as needed for muscle spasms. 03/20/17   Fayrene Helper, PA-C  ibuprofen (ADVIL,MOTRIN) 600 MG tablet Take 1 tablet (600 mg total) by mouth every 6 (six) hours as needed. 03/20/17   Fayrene Helper, PA-C  predniSONE  (DELTASONE) 10 MG tablet Take 4 tablets (40 mg total) by mouth daily for 5 days. 01/01/18 01/06/18  Bill Salinas, PA-C    Family History No family history on file.  Social History Social History   Tobacco Use  . Smoking status: Not on file  Substance Use Topics  . Alcohol use: Not on file  . Drug use: Not on file     Allergies   Patient has no known allergies.   Review of Systems Review of Systems  Constitutional: Negative.  Negative for chills and fever.  HENT: Negative.  Negative for congestion, rhinorrhea and trouble swallowing.   Eyes: Negative.  Negative for visual disturbance.  Respiratory: Positive for wheezing.   Cardiovascular: Negative.  Negative for chest pain and leg swelling.  Gastrointestinal: Negative.  Negative for abdominal pain, diarrhea, nausea and vomiting.  Genitourinary: Negative.  Negative for dysuria and hematuria.  Musculoskeletal: Negative.  Negative for arthralgias and myalgias.  Skin: Negative.  Negative for rash.  Neurological: Negative.  Negative for dizziness, weakness and headaches.   Physical Exam Updated Vital Signs BP 123/62   Pulse 69   Temp 97.9 F (36.6 C) (Oral)   Resp 15   Ht 5\' 5"  (1.651 m)   Wt 81.6 kg   SpO2 99%   BMI 29.95 kg/m   Physical Exam  Constitutional: He appears well-developed and well-nourished. No distress.  HENT:  Head: Normocephalic and atraumatic.  Right Ear: External ear normal.  Left Ear: External ear normal.  Nose: Nose normal.  Mouth/Throat: Uvula is  midline, oropharynx is clear and moist and mucous membranes are normal.  Eyes: Pupils are equal, round, and reactive to light. EOM are normal.  Neck: Trachea normal, normal range of motion, full passive range of motion without pain and phonation normal. Neck supple. No tracheal deviation present.  Cardiovascular: Normal rate, regular rhythm, normal heart sounds and intact distal pulses.  Pulmonary/Chest: Effort normal and breath sounds normal. No  respiratory distress. He has no wheezes. He has no rhonchi. He exhibits no tenderness and no deformity.  Abdominal: Soft. There is no tenderness. There is no rigidity, no rebound and no guarding.  Musculoskeletal: Normal range of motion. He exhibits no tenderness.  Neurological: He is alert. GCS eye subscore is 4. GCS verbal subscore is 5. GCS motor subscore is 6.  Speech is clear and goal oriented, follows commands Major Cranial nerves without deficit, no facial droop Moves extremities without ataxia, coordination intact Normal gait  Skin: Skin is warm and dry. Capillary refill takes less than 2 seconds.  Psychiatric: He has a normal mood and affect. His behavior is normal.   ED Treatments / Results  Labs (all labs ordered are listed, but only abnormal results are displayed) Labs Reviewed - No data to display  EKG None  Radiology Dg Chest 2 View  Result Date: 01/01/2018 CLINICAL DATA:  Wheezing and shortness of breath for the past 2 days. History of asthma. EXAM: CHEST - 2 VIEW COMPARISON:  None. FINDINGS: The heart size and mediastinal contours are within normal limits. Both lungs are clear. The visualized skeletal structures are unremarkable. IMPRESSION: Normal chest. Electronically Signed   By: Obie Dredge M.D.   On: 01/01/2018 08:12    Procedures Procedures (including critical care time)  Medications Ordered in ED Medications  albuterol (PROVENTIL HFA;VENTOLIN HFA) 108 (90 Base) MCG/ACT inhaler 1-2 puff (1 puff Inhalation Given 01/01/18 0633)  albuterol (PROVENTIL) (2.5 MG/3ML) 0.083% nebulizer solution 5 mg (5 mg Nebulization Given 01/01/18 0542)  AEROCHAMBER PLUS FLO-VU LARGE MISC 1 each (1 each Other Given 01/01/18 1610)     Initial Impression / Assessment and Plan / ED Course  I have reviewed the triage vital signs and the nursing notes.  Pertinent labs & imaging results that were available during my care of the patient were reviewed by me and considered in my  medical decision making (see chart for details).  Clinical Course as of Jan 02 815  Sat Jan 01, 2018  9604 Discussed with Dr. Erma Heritage, agrees with cxr, inhaler + spacer, and short course prednisone then discharge   [BM]    Clinical Course User Index [BM] Bill Salinas, PA-C   Patient ambulated in ED with O2 saturations maintained >97 on room air, no current signs of respiratory distress. Lung exam improved after nebulizer treatment.  Patient prescribed 5-day burst dose of prednisone. Pt states he is breathing at baseline. Pt has been instructed to continue using prescribed medications and to speak with PCP about today's exacerbation.  Chest x-ray negative. Lungs clear to auscultation bilaterally.  Patient given albuterol inhaler refill and spacer.  Patient afebrile, not tachycardic, not hypotensive, well-appearing and in no acute distress.  At this time there does not appear to be any evidence of an acute emergency medical condition and the patient appears stable for discharge with appropriate outpatient follow up. Diagnosis was discussed with patient who verbalizes understanding of care plan and is agreeable to discharge. I have discussed return precautions with patient who verbalizes understanding of return precautions. Patient  strongly encouraged to follow-up with their PCP. All questions answered.  Patient's case discussed with Dr. Erma Heritage who agrees with plan to discharge with follow-up.     Note: Portions of this report may have been transcribed using voice recognition software. Every effort was made to ensure accuracy; however, inadvertent computerized transcription errors may still be present.    Final Clinical Impressions(s) / ED Diagnoses   Final diagnoses:  Mild intermittent asthma with exacerbation    ED Discharge Orders         Ordered    predniSONE (DELTASONE) 10 MG tablet  Daily     01/01/18 0742           Bill Salinas, PA-C 01/01/18 1610      Shaune Pollack, MD 01/01/18 [phone]

## 2018-01-01 NOTE — ED Triage Notes (Signed)
Pt reports his asthma is "out of control" b/c he has not had a pump in two years.  Wheezing in all fields.

## 2018-01-01 NOTE — ED Notes (Signed)
Pt transported to xray 

## 2018-02-05 ENCOUNTER — Emergency Department (HOSPITAL_COMMUNITY)
Admission: EM | Admit: 2018-02-05 | Discharge: 2018-02-05 | Disposition: A | Discharge status: Home / Self Care | Payer: Medicaid Other | Attending: Emergency Medicine | Admitting: Emergency Medicine

## 2018-02-05 ENCOUNTER — Encounter (HOSPITAL_COMMUNITY): Payer: Self-pay

## 2018-02-05 DIAGNOSIS — F17210 Nicotine dependence, cigarettes, uncomplicated: Secondary | ICD-10-CM | POA: Insufficient documentation

## 2018-02-05 DIAGNOSIS — Z79899 Other long term (current) drug therapy: Secondary | ICD-10-CM | POA: Insufficient documentation

## 2018-02-05 DIAGNOSIS — H11421 Conjunctival edema, right eye: Secondary | ICD-10-CM

## 2018-02-05 NOTE — ED Notes (Signed)
Pt stable, ambulatory, states understanding of discharge instructions 

## 2018-02-05 NOTE — ED Provider Notes (Signed)
[facility] EMERGENCY DEPARTMENT Provider Note   CSN: [id number] Arrival date & time: 02/05/18  1802     History   Chief Complaint Chief Complaint  Patient presents with  . Eye Problem    HPI Breylen Agyeman is a 22 y.o. male.  22 year old male presents with complaint of a bump on his right lateral eye for the past few days.  Patient does not wear contacts, he does wear glasses.  He denies light sensitivity, pain in the eye, changes in vision, eye drainage, pain with movement.  Patient reports that he has discomfort when he blinks his eyes as he feels he can move his eyelid over the bump.  Patient works for [employer], states he may have gotten dust in his eye but does not recall any foreign body sensation or eye injury.  No other complaints or concerns.     History reviewed. No pertinent past medical history.  Patient Active Problem List   Diagnosis Date Noted  . Psychoses (HCC)   . Psychosis (HCC) 07/10/2014    History reviewed. No pertinent surgical history.      Home Medications    Prior to Admission medications   Medication Sig Start Date End Date Taking? Authorizing Provider  cyclobenzaprine (FLEXERIL) 10 MG tablet Take 1 tablet (10 mg total) by mouth 2 (two) times daily as needed for muscle spasms. 03/20/17   Fayrene Helper, PA-C  ibuprofen (ADVIL,MOTRIN) 600 MG tablet Take 1 tablet (600 mg total) by mouth every 6 (six) hours as needed. 03/20/17   Fayrene Helper, PA-C    Family History History reviewed. No pertinent family history.  Social History Social History   Tobacco Use  . Smoking status: Current Every Day Smoker  . Smokeless tobacco: Never Used  Substance Use Topics  . Alcohol use: Yes  . Drug use: Never     Allergies   Patient has no known allergies.   Review of Systems Review of Systems  Constitutional: Negative for fever.  HENT: Negative for congestion, sinus pressure and sinus pain.   Eyes: Negative for photophobia, pain,  discharge, redness, itching and visual disturbance.  Skin: Negative for rash and wound.  Allergic/Immunologic: Negative for immunocompromised state.  All other systems reviewed and are negative.    Physical Exam Updated Vital Signs Temp 98.9 F (37.2 C) (Oral)   Physical Exam  Constitutional: He is oriented to person, place, and time. He appears well-developed and well-nourished. No distress.  HENT:  Head: Normocephalic and atraumatic.  Nose: Nose normal.  Eyes: Pupils are equal, round, and reactive to light. EOM are normal. Right eye exhibits no discharge. Left eye exhibits no discharge. No scleral icterus.    Neck: Neck supple.  Pulmonary/Chest: Effort normal.  Neurological: He is alert and oriented to person, place, and time.  Skin: He is not diaphoretic.  Psychiatric: He has a normal mood and affect. His behavior is normal.  Nursing note and vitals reviewed.    ED Treatments / Results  Labs (all labs ordered are listed, but only abnormal results are displayed) Labs Reviewed - No data to display  EKG None  Radiology No results found.  Procedures Procedures (including critical care time)  Medications Ordered in ED Medications - No data to display   Initial Impression / Assessment and Plan / ED Course  I have reviewed the triage vital signs and the nursing notes.  Pertinent labs & imaging results that were available during my care of the patient were reviewed by  me and considered in my medical decision making (see chart for details).  Clinical Course as of Feb 06 1831  Sat Feb 05, 2018  [id number] 22 year old male presents with complaint of a bump on his right eye.  On exam patient has a small area of chemosis.  Recommend patient use Zyrtec, Flonase, cool compress to the eye as well as preservative-free eyedrops as needed.  Referred to ophthalmology for recheck.   [LM]    Clinical Course User Index [LM] Jeannie FendMurphy, Adi Seales A, PA-C   Final Clinical Impressions(s) / ED  Diagnoses   Final diagnoses:  Chemosis of conjunctiva, right    ED Discharge Orders    None       Alden HippMurphy, Yaniyah Koors A, PA-C 02/05/18 1832    Tilden Fossaees, Elizabeth, MD 02/06/18 1053

## 2018-02-05 NOTE — Discharge Instructions (Signed)
Cool compress to eyes. Zyrtec and Flonase daily. Preservative free eye drops as needed.

## 2018-02-05 NOTE — ED Triage Notes (Signed)
Reports right eye drainage and irritation x2 days

## 2020-09-07 IMAGING — DX DG CHEST 2V
2 series · 2 of 2 positions shown · non-contrast
Comparison: None.

CLINICAL DATA: Wheezing and shortness of breath for the past 2
days. History of asthma.

EXAM:
CHEST - 2 VIEW

[chest pa]
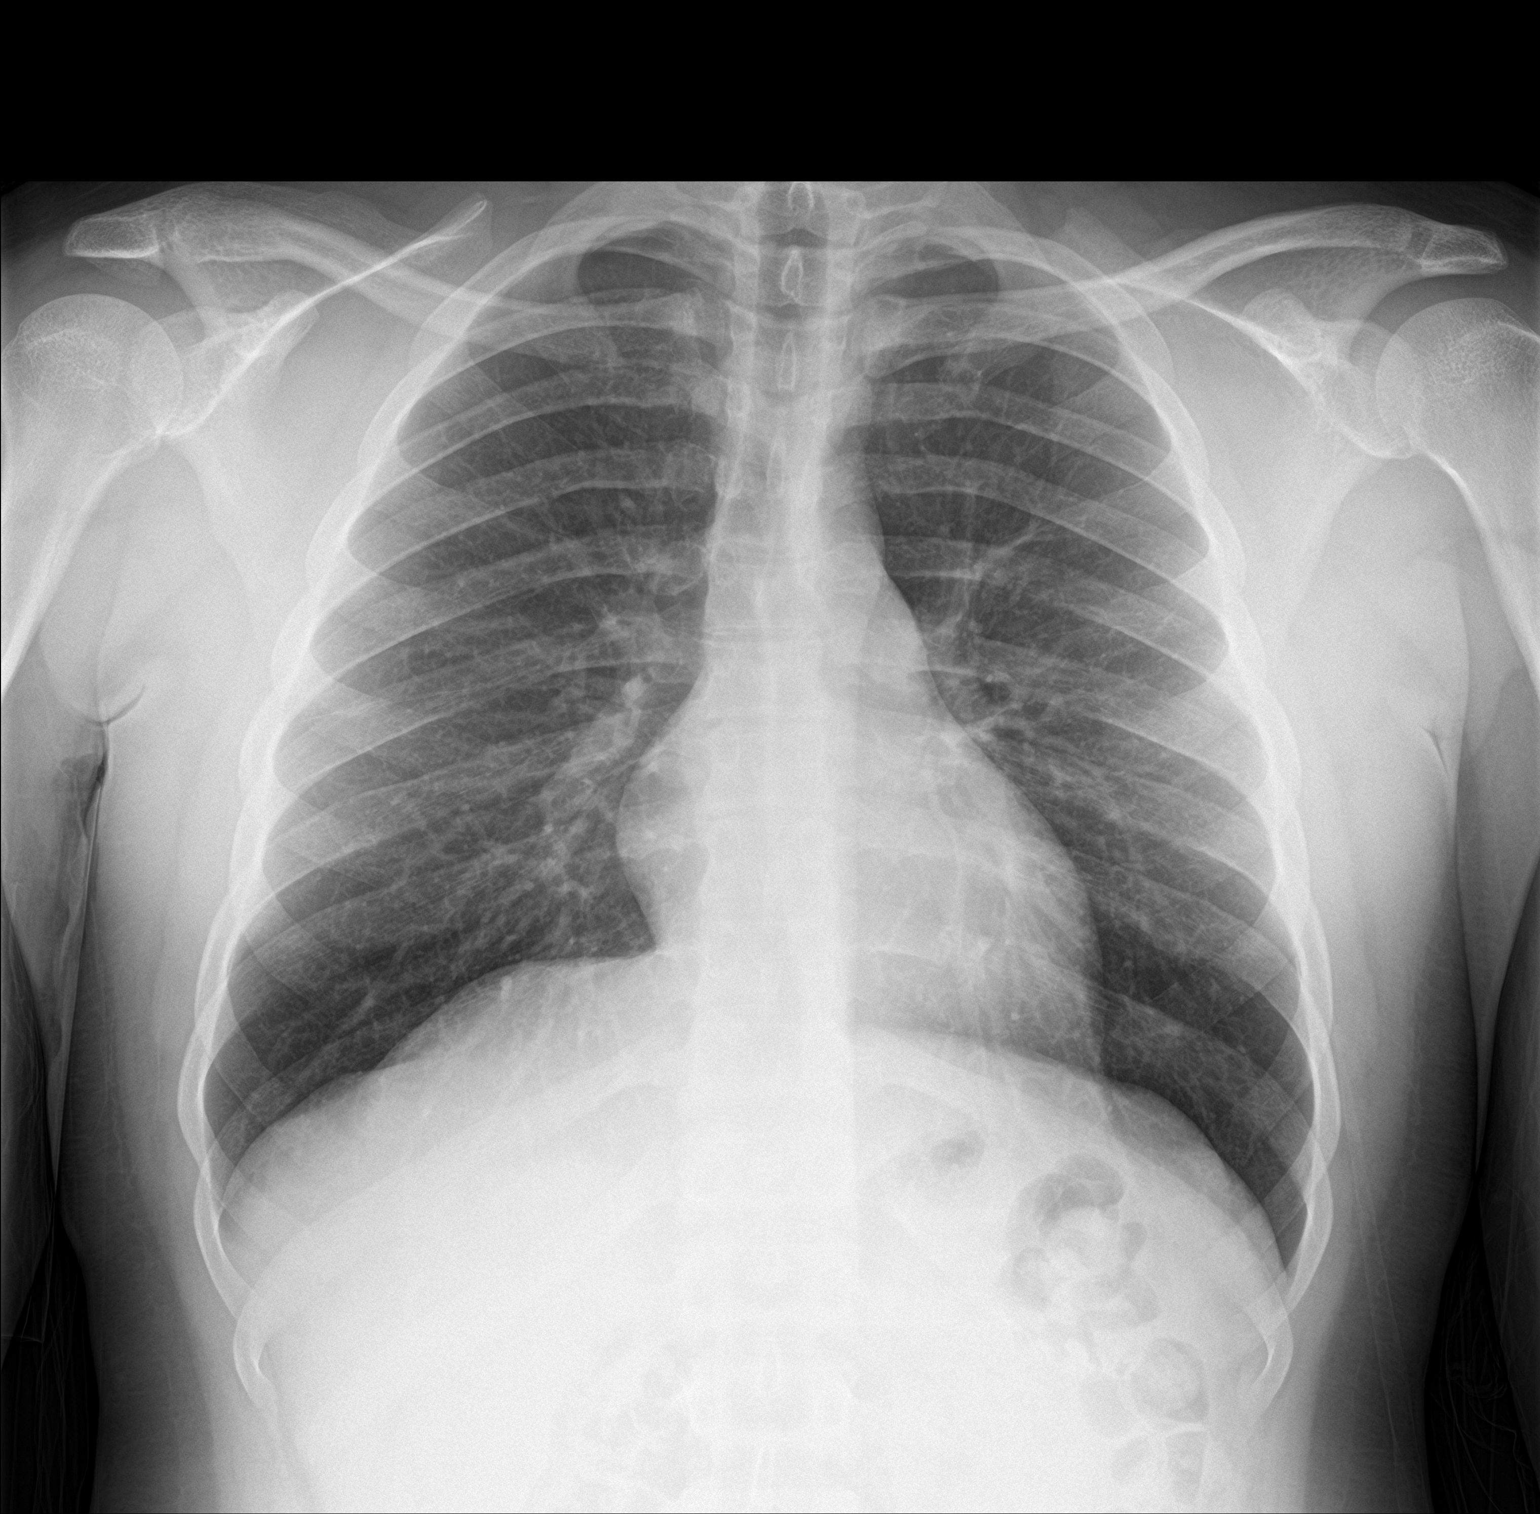

[chest lat]
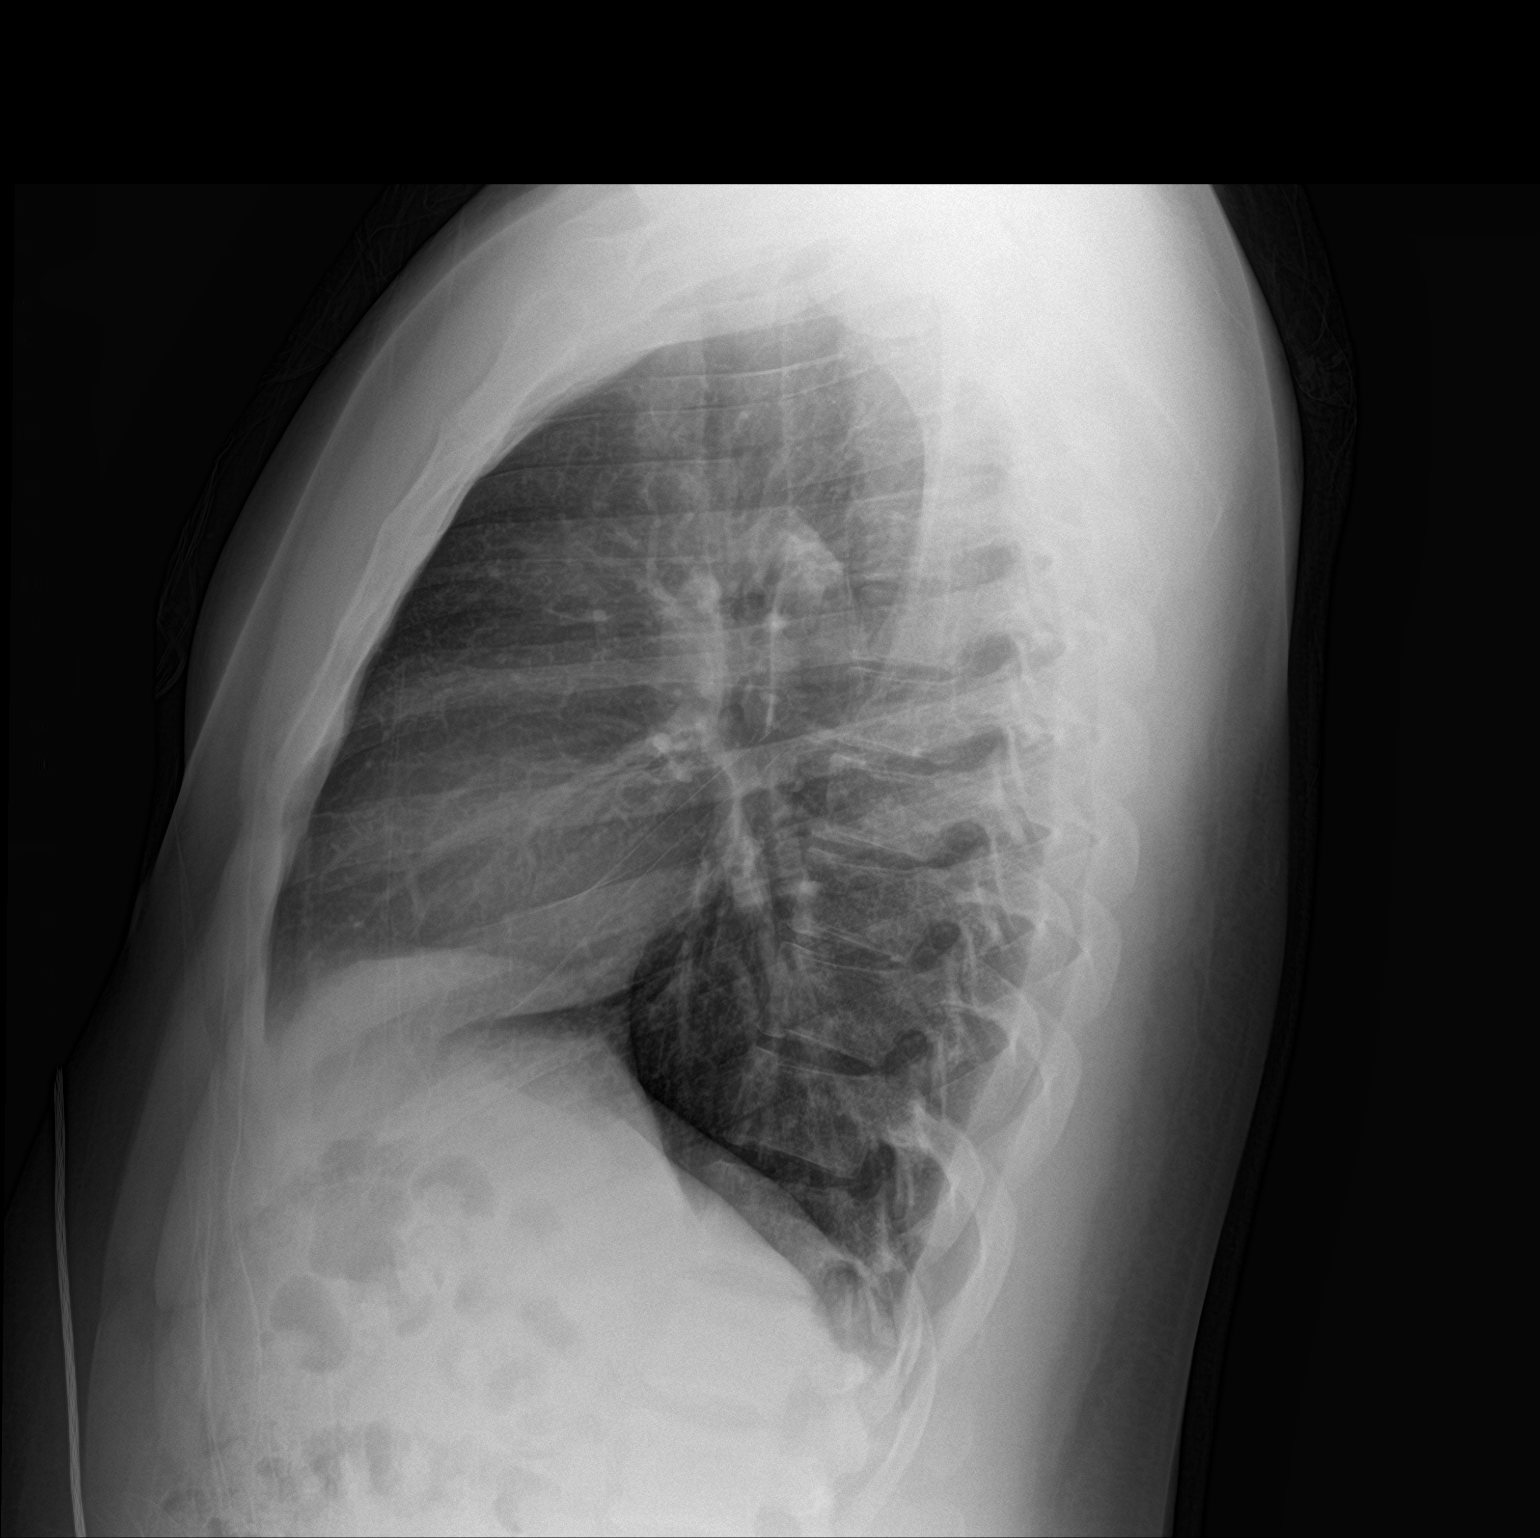

[2 of 2 positions shown; findings below may reference images not displayed]

FINDINGS: The heart size and mediastinal contours are within normal limits.
Both lungs are clear. The visualized skeletal structures are
unremarkable.
IMPRESSION: Normal chest.
